# Patient Record
Sex: Male | Born: 1995 | Race: Black or African American | Hispanic: No | Marital: Single | Smoking: Never smoker
Health system: Southern US, Community
[De-identification: ages and names within clinical notes are randomized; demographics above are authoritative.]

## PROBLEM LIST (undated history)

## (undated) DIAGNOSIS — L309 Dermatitis, unspecified: Secondary | ICD-10-CM

## (undated) DIAGNOSIS — T7840XA Allergy, unspecified, initial encounter: Secondary | ICD-10-CM

## (undated) DIAGNOSIS — J302 Other seasonal allergic rhinitis: Secondary | ICD-10-CM

## (undated) DIAGNOSIS — L509 Urticaria, unspecified: Secondary | ICD-10-CM

## (undated) HISTORY — DX: Dermatitis, unspecified: L30.9

## (undated) HISTORY — PX: CIRCUMCISION: SUR203

## (undated) HISTORY — DX: Urticaria, unspecified: L50.9

## (undated) HISTORY — PX: URETHRA SURGERY: SHX824

## (undated) HISTORY — DX: Allergy, unspecified, initial encounter: T78.40XA

---
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
## 2017-05-01 ENCOUNTER — Telehealth: Payer: Self-pay

## 2017-05-01 NOTE — Telephone Encounter (Signed)
Pre visit call completed 

## 2017-05-02 ENCOUNTER — Ambulatory Visit (INDEPENDENT_AMBULATORY_CARE_PROVIDER_SITE_OTHER): Payer: Managed Care, Other (non HMO) | Admitting: Family Medicine

## 2017-05-02 VITALS — BP 110/78 | HR 77 | Temp 99.0°F | Ht 69.0 in | Wt 139.8 lb

## 2017-05-02 DIAGNOSIS — F32A Depression, unspecified: Secondary | ICD-10-CM

## 2017-05-02 DIAGNOSIS — Z9189 Other specified personal risk factors, not elsewhere classified: Secondary | ICD-10-CM

## 2017-05-02 DIAGNOSIS — R5383 Other fatigue: Secondary | ICD-10-CM | POA: Diagnosis not present

## 2017-05-02 DIAGNOSIS — F329 Major depressive disorder, single episode, unspecified: Secondary | ICD-10-CM

## 2017-05-02 NOTE — Progress Notes (Signed)
[facility] [place] [address], [address] [place], [place] [address] 336 [phone] [phone]  Date:  05/02/2017   Name:  Mark Juarez   DOB:  03/11/1996   MRN:  [id number]  PCP:  Pearline Cablesopland, Jessica C, MD    Chief Complaint: Establish Care (Pt here to est care. c/o weight loss over the past 6 months. )   History of Present Illness:  Mark Juarez is a 21 y.o. very pleasant male patient who presents with the following:  Here today as a new patient to our office.  Reviewed available records on Epic  He had been getting his care from another doctor through his medicaid but this doctor has left the area and he now has [place] insurnace  His mother does most of the talking today- she reports that her son as become very unmotivated over the last 6-9 months.  He  "just wants to play x-box all day."  Also that does not eat well, mostly eats junk food.  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  He is studying online only and is out of class for the summer He lives with his parents.   He tends to go to bed at 0500- stays up very late playing videogames, will get up around noon He never got his learners permit and does not drive  He does have a few friends in this area but rarely will get together with them  He graduated HS last year- in 2017.   No major changes at home  Mother states that she asks him about self harm all the time and he denies any risk of this They also deny any substance abuse   His physical health has been generally good except for mild asthma and allergies   There are no active problems to display for this patient.   Past Medical History:  Diagnosis Date  . Allergy   . Asthma   . Seasonal allergies     Past Surgical History:  Procedure Laterality Date  . CIRCUMCISION     . URETHRA SURGERY      Social History  Substance Use Topics  . Smoking status: Never Smoker  . Smokeless tobacco: Not on file  . Alcohol use No    Family History  Problem Relation Age of Onset  . Diabetes Mother   . Hypertension Mother   . Hyperlipidemia Mother   . Heart attack Mother   . Heart disease Mother   . Diabetes Father   . Hypertension Father   . Hyperlipidemia Father     Allergies  Allergen Reactions  . Peanuts [Peanut Oil] Anaphylaxis  . Penicillins Swelling    Medication list has been reviewed and updated.  Current Outpatient Prescriptions on File Prior to Visit  Medication Sig Dispense Refill  . albuterol (PROVENTIL HFA;VENTOLIN HFA) 108 (90 BASE) MCG/ACT inhaler Inhale 2 puffs into the lungs every 6 (six) hours as needed. For shortness of breath    . albuterol (PROVENTIL) (2.5 MG/3ML) 0.083% nebulizer solution Take 2.5 mg by nebulization every 6 (six) hours as needed. For shortness of breath    . EPINEPHrine (EPIPEN) 0.3 mg/0.3 mL IJ SOAJ injection Inject 0.3 mLs (0.3 mg total) into the muscle as needed. 2 Device 1  .  loratadine (CLARITIN) 10 MG tablet Take 10 mg by mouth daily.     No current facility-administered medications on file prior to visit.     Review of Systems:  As per HPI- otherwise negative. Denies any physical pain or other physical sx No SI   Physical Examination: Vitals:   05/02/17 1636  BP: 110/78  Pulse: 77  Temp: 99 F (37.2 C)   Vitals:   05/02/17 1636  Weight: 139 lb 12.8 oz (63.4 kg)  Height: 5\' 9"  (1.753 m)   Body mass index is 20.64 kg/m. Ideal Body Weight: Weight in (lb) to have BMI = 25: 168.9  GEN: WDWN, NAD, Non-toxic, A & O x 3, normal weight but on the thin side, does not say much HEENT: Atraumatic, Normocephalic. Neck supple. No masses, No LAD. Ears and Nose: No external deformity. CV: RRR, No M/G/R. No JVD. No thrill. No extra heart sounds. PULM: CTA B, no wheezes, crackles, rhonchi. No retractions.  No resp. distress. No accessory muscle use. ABD: S, NT, ND, +BS. No rebound. No HSM. EXTR: No c/c/e NEURO Normal gait.  PSYCH: quiet, does not speak for himself like most teens his age   Assessment and Plan: Fatigue due to depression - Plan: CBC, Comprehensive metabolic panel, Lipid panel, TSH  Lack of motivation  Here today with his parents due to concern that he has lost motivation to move forward in life and is mostly sitting around playing video games.  Detailed discussion with the family- I suspect that much of Khush's sx are due to depression, and suggested that we start medication. Bright does not wish to start mediation at this time, but does seem to understand that he will need to make some changes in his life.  We discussed several specific goals for him to work towards Will also obtain basic labs as above to look for any physical cause of his sx Plan to visit in one month   Signed Abbe Amsterdam, MD

## 2017-05-02 NOTE — Patient Instructions (Addendum)
It was very nice to see you today!  I will be in touch with your labs I am sorry that you are having a hard time, but do think that you will be able to feel better by making a few changes Please work on keeping a normal eat/ sleep/ work/ exercise schedule. Staying up all night is not a good idea!  We need you to find a job and also start working out again.  These steps will help to boost your mood I would also encourage you to get your driving permit and work towards your license- this will give you greater freedom and confidence.    Please see me in about one month so we can check on how you are doing.  If you are not seeing improvement we may want to consider medication.  I do suspect that you are suffering from depression

## 2017-05-03 ENCOUNTER — Encounter: Payer: Self-pay | Admitting: Family Medicine

## 2017-05-03 LAB — COMPREHENSIVE METABOLIC PANEL
ALBUMIN: 4.8 g/dL (ref 3.5–5.2)
ALK PHOS: 61 U/L (ref 52–171)
ALT: 8 U/L (ref 0–53)
AST: 12 U/L (ref 0–37)
BILIRUBIN TOTAL: 0.9 mg/dL (ref 0.3–1.2)
BUN: 10 mg/dL (ref 6–23)
CALCIUM: 10.3 mg/dL (ref 8.4–10.5)
CO2: 29 mEq/L (ref 19–32)
Chloride: 104 mEq/L (ref 96–112)
Creatinine, Ser: 1.04 mg/dL (ref 0.40–1.50)
GFR: 118.47 mL/min (ref >60.00)
Glucose, Bld: 88 mg/dL (ref 70–99)
POTASSIUM: 3.9 meq/L (ref 3.5–5.1)
Sodium: 140 mEq/L (ref 135–145)
TOTAL PROTEIN: 8.2 g/dL (ref 6.0–8.3)

## 2017-05-03 LAB — CBC
HEMATOCRIT: 42.9 % (ref 36.0–49.0)
HEMOGLOBIN: 13.8 g/dL (ref 12.0–16.0)
MCHC: 32.2 g/dL (ref 31.0–37.0)
MCV: 75.7 fl — ABNORMAL LOW (ref 78.0–98.0)
PLATELETS: 223 10*3/uL (ref 150.0–575.0)
RBC: 5.67 Mil/uL (ref 3.80–5.70)
RDW: 15.1 % (ref 11.4–15.5)
WBC: 3.7 10*3/uL — ABNORMAL LOW (ref 4.5–13.5)

## 2017-05-03 LAB — TSH: TSH: 1.05 u[IU]/mL (ref 0.40–5.00)

## 2017-05-03 LAB — LIPID PANEL
CHOLESTEROL: 186 mg/dL (ref 0–200)
HDL: 44.1 mg/dL (ref >39.00)
LDL Cholesterol: 133 mg/dL — ABNORMAL HIGH (ref 0–99)
NonHDL: 141.6
TRIGLYCERIDES: 43 mg/dL (ref 0.0–149.0)
Total CHOL/HDL Ratio: 4
VLDL: 8.6 mg/dL (ref 0.0–40.0)

## 2018-02-04 NOTE — Progress Notes (Deleted)
[facility] [address], [address] [place], [place] [address] [phone] [phone]  Date:  02/05/2018   Name:  Mark Juarez   DOB:  03/02/1996   MRN:  [id number]  PCP:  Pearline Cablesopland, Meloney Feld C, MD    Chief Complaint: No chief complaint on file.   History of Present Illness:  Mark Juarez is a 22 y.o. very pleasant male patient who presents with the following:  Here today for a CPE I have seen this pt once previously, in May of 2018.  At that time his mother was concerned that he seemed depressed, was playing video games all day and not doing much else Here today with his parents due to concern that he has lost motivation to move forward in life and is mostly sitting around playing video games.  Detailed discussion with the family- I suspect that much of Mark Juarez's sx are due to depression, and suggested that we start medication. Mark Juarez does not wish to start mediation at this time, but does seem to understand that he will need to make some changes in his life.  We discussed several specific goals for him to work towards Will also obtain basic labs as above to look for any physical cause of his sx Plan to visit in one month   His lab eval at that time showed minimally low wbc count, OW normal  Flu: Tetanus: Gardasil:  Will need to pull [place] shot records for him  There are no active problems to display for this patient.   Past Medical History:  Diagnosis Date  . Allergy   . Asthma   . Seasonal allergies     Past Surgical History:  Procedure Laterality Date  . CIRCUMCISION    . URETHRA SURGERY      Social History   Tobacco Use  . Smoking status: Never Smoker  Substance Use Topics  . Alcohol use: No  . Drug use: No    Family History  Problem Relation Age of Onset  . Diabetes Mother   . Hypertension Mother   . Hyperlipidemia Mother   . Heart attack Mother   . Heart disease Mother   . Diabetes Father   . Hypertension  Father   . Hyperlipidemia Father     Allergies  Allergen Reactions  . Peanuts [Peanut Oil] Anaphylaxis  . Penicillins Swelling    Medication list has been reviewed and updated.  Current Outpatient Medications on File Prior to Visit  Medication Sig Dispense Refill  . albuterol (PROVENTIL HFA;VENTOLIN HFA) 108 (90 BASE) MCG/ACT inhaler Inhale 2 puffs into the lungs every 6 (six) hours as needed. For shortness of breath    . albuterol (PROVENTIL) (2.5 MG/3ML) 0.083% nebulizer solution Take 2.5 mg by nebulization every 6 (six) hours as needed. For shortness of breath    . EPINEPHrine (EPIPEN) 0.3 mg/0.3 mL IJ SOAJ injection Inject 0.3 mLs (0.3 mg total) into the muscle as needed. 2 Device 1  . loratadine (CLARITIN) 10 MG tablet Take 10 mg by mouth daily.     No current facility-administered medications on file prior to visit.     Review of Systems:  As per HPI- otherwise negative.   Physical Examination: There were no vitals filed for this visit. There were no vitals filed for this visit. There is no height or weight on file to calculate BMI. Ideal Body Weight:    GEN: WDWN, NAD, Non-toxic, A & O x  3 HEENT: Atraumatic, Normocephalic. Neck supple. No masses, No LAD. Ears and Nose: No external deformity. CV: RRR, No M/G/R. No JVD. No thrill. No extra heart sounds. PULM: CTA B, no wheezes, crackles, rhonchi. No retractions. No resp. distress. No accessory muscle use. ABD: S, NT, ND, +BS. No rebound. No HSM. EXTR: No c/c/e NEURO Normal gait.  PSYCH: Normally interactive. Conversant. Not depressed or anxious appearing.  Calm demeanor.    Assessment and Plan: ***  Signed Abbe AmsterdamJessica Mauricio Dahlen, MD

## 2018-02-05 ENCOUNTER — Encounter: Payer: Managed Care, Other (non HMO) | Admitting: Family Medicine

## 2018-03-04 NOTE — Progress Notes (Signed)
[facility] [address] [place], [place] [address] [phone]  Date:  03/06/2018   Name:  Mark Juarez   DOB:  March 07, 1996   MRN:  [id number]  PCP:  Pearline Cables, MD    Chief Complaint: Annual Exam   History of Present Illness:  Mark Juarez is a 22 y.o. very pleasant male patient who presents with the following:  Here today for a CPE I last saw him as a new patient just over a year ago:  He had been getting his care from another doctor through his medicaid but this doctor has left the area and he now has [place] insurnace  His mother does most of the talking today- she reports that her son as become very unmotivated over the last 6-9 months.  He  "just wants to play x-box all day."  Also that does not eat well, mostly eats junk food.  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  He is studying online only and is out of class for the summer He lives with his parents.   He tends to go to bed at 0500- stays up very late playing videogames, will get up around noon He never got his learners permit and does not drive He does have a few friends in this area but rarely will get together with the He graduated HS last year- in 2017.   No major changes at home Mother states that she asks him about self harm all the time and he denies any risk of this They also deny any substance abuse  His physical health has been generally good except for mild asthma and allergies  A/P Here today with his parents due to concern that he has lost motivation to move forward in life and is mostly sitting around playing video games.  Detailed discussion with the family- I suspect that much of Mark Juarez's sx are due to depression, and suggested that we start medication. Osmar does not  wish to start mediation at this time, but does seem to understand that he will need to make some changes in his life.  We discussed several specific goals for him to work towards Will also obtain basic labs as above to look for any physical cause of his sx Plan to visit in one month   Labs were ok except for mild leukopenia a year ago Pt reports that he is doing well; his mother is again present and agrees that he is doing better He is now at [employer], and he is working at [employer] over the summer He feels like his mood is better He is more active and motivated. He is spending time with friends in real life- not just over computers/ phone.  He would like to have a flu shot and start HPV series today.  Pulled his immunization records from the [facility] database but it does not seem to be complete- his 6th grade shots are missing.  His mother will bring in the records that she has at home as well  They have noted some sinus drainage- for about one month He is sneezing Some runny nose Some cough No fever He tried some dayquil OTC, zyrtec  He still has not gotten his drivers  permit- however he wants to do so and wants to get a car.   He is not exercising regularly at this time Lives with his mother  There are no active problems to display for this patient.   Past Medical History:  Diagnosis Date  . Allergy   . Asthma   . Seasonal allergies     Past Surgical History:  Procedure Laterality Date  . CIRCUMCISION    . URETHRA SURGERY      Social History   Tobacco Use  . Smoking status: Never Smoker  . Smokeless tobacco: Never Used  Substance Use Topics  . Alcohol use: No  . Drug use: No    Family History  Problem Relation Age of Onset  . Diabetes Mother   . Hypertension Mother   . Hyperlipidemia Mother   . Heart attack Mother   . Heart disease Mother   . Diabetes Father   . Hypertension Father   . Hyperlipidemia Father     Allergies  Allergen Reactions  . Peanuts [Peanut  Oil] Anaphylaxis  . Penicillins Swelling    Medication list has been reviewed and updated.  Current Outpatient Medications on File Prior to Visit  Medication Sig Dispense Refill  . albuterol (PROVENTIL HFA;VENTOLIN HFA) 108 (90 BASE) MCG/ACT inhaler Inhale 2 puffs into the lungs every 6 (six) hours as needed. For shortness of breath    . albuterol (PROVENTIL) (2.5 MG/3ML) 0.083% nebulizer solution Take 2.5 mg by nebulization every 6 (six) hours as needed. For shortness of breath    . EPINEPHrine (EPIPEN) 0.3 mg/0.3 mL IJ SOAJ injection Inject 0.3 mLs (0.3 mg total) into the muscle as needed. 2 Device 1  . loratadine (CLARITIN) 10 MG tablet Take 10 mg by mouth daily.     No current facility-administered medications on file prior to visit.     Review of Systems:  As per HPI- otherwise negative. No fever or chills He feels that his mood is better and he is more positive about life   Physical Examination: Vitals:   03/06/18 1049  BP: 90/74  Pulse: 94  Resp: 16  SpO2: 100%   Vitals:   03/06/18 1049  Weight: 146 lb 3.2 oz (66.3 kg)  Height: 5\' 9"  (1.753 m)   Body mass index is 21.59 kg/m. Ideal Body Weight: Weight in (lb) to have BMI = 25: 168.9  GEN: WDWN, NAD, Non-toxic, A & O x 3 HEENT: Atraumatic, Normocephalic. Neck supple. No masses, No LAD.  Bilateral TM wnl, oropharynx normal.  PEERL,EOMI.   Ears and Nose: No external deformity. CV: RRR, No M/G/R. No JVD. No thrill. No extra heart sounds. PULM: CTA B, no wheezes, crackles, rhonchi. No retractions. No resp. distress. No accessory muscle use. ABD: S, NT, ND. No rebound. No HSM. EXTR: No c/c/e NEURO Normal gait.  PSYCH: Normally interactive. Conversant. Not depressed or anxious appearing.  Calm demeanor.  Looks well, normal weight Normal DTR of all extremities   Repeat BP 105/80  Assessment and Plan: Physical exam  Other neutropenia (HCC) - Plan: CBC with Differential/Platelet  Immunization due - Plan: Flu  Vaccine QUAD 6+ mos PF IM (Fluarix Quad PF), HPV 9-valent vaccine,Recombinat  Following up/ CPE today Mark Juarez is doing better- he is in school and planning on working over the summer He and his mom feel like he is more active and mood is better Encouraged him to start a regular exercise program His mom will bring in the rest of his immunizations for  me to complete in his chart Will plan further follow- up pending labs.   Signed Abbe AmsterdamJessica Copland, MD  Received labs 3/23, message to pt:  White cell count is back to normal No anemia Your MCV (average red cell size) is slighly low.  This may indicate that you are low on iron. However, your MCV is just slightly low.  I would encourage you to try and increase your dietary iron (fortified cereal, also using a case iron skillet to cook some of the time can help). Overall I am not concerned and am glad to see your white cell count back to normal!  Please work on starting an exercise program and take care!  Let's visit in one year  Results for orders placed or performed in visit on 03/06/18  CBC with Differential/Platelet  Result Value Ref Range   WBC 4.8 4.5 - 13.5 K/uL   RBC 5.81 (H) 3.80 - 5.70 Mil/uL   Hemoglobin 14.2 12.0 - 16.0 g/dL   HCT 16.144.9 09.636.0 - 04.549.0 %   MCV 77.2 (L) 78.0 - 98.0 fl   MCHC 31.7 31.0 - 37.0 g/dL   RDW 40.914.6 81.111.4 - 91.415.5 %   Platelets 214.0 150.0 - 575.0 K/uL   Neutrophils Relative % 33.6 (L) 43.0 - 71.0 %   Lymphocytes Relative 48.7 (H) 24.0 - 48.0 %   Monocytes Relative 10.1 3.0 - 12.0 %   Eosinophils Relative 7.6 (H) 0.0 - 5.0 %   Basophils Relative 0.0 0.0 - 3.0 %   Neutro Abs 1.6 1.4 - 7.7 K/uL   Lymphs Abs 2.3 0.7 - 4.0 K/uL   Monocytes Absolute 0.5 0.1 - 1.0 K/uL   Eosinophils Absolute 0.4 0.0 - 0.7 K/uL   Basophils Absolute 0.0 0.0 - 0.1 K/uL

## 2018-03-06 ENCOUNTER — Encounter: Payer: Self-pay | Admitting: Family Medicine

## 2018-03-06 ENCOUNTER — Ambulatory Visit (INDEPENDENT_AMBULATORY_CARE_PROVIDER_SITE_OTHER): Payer: PRIVATE HEALTH INSURANCE | Admitting: Family Medicine

## 2018-03-06 VITALS — BP 90/74 | HR 94 | Resp 16 | Ht 69.0 in | Wt 146.2 lb

## 2018-03-06 DIAGNOSIS — D708 Other neutropenia: Secondary | ICD-10-CM | POA: Diagnosis not present

## 2018-03-06 DIAGNOSIS — Z23 Encounter for immunization: Secondary | ICD-10-CM

## 2018-03-06 DIAGNOSIS — Z0000 Encounter for general adult medical examination without abnormal findings: Secondary | ICD-10-CM | POA: Diagnosis not present

## 2018-03-06 LAB — CBC WITH DIFFERENTIAL/PLATELET
BASOS ABS: 0 10*3/uL (ref 0.0–0.1)
BASOS PCT: 0 % (ref 0.0–3.0)
EOS ABS: 0.4 10*3/uL (ref 0.0–0.7)
Eosinophils Relative: 7.6 % — ABNORMAL HIGH (ref 0.0–5.0)
HEMATOCRIT: 44.9 % (ref 36.0–49.0)
HEMOGLOBIN: 14.2 g/dL (ref 12.0–16.0)
LYMPHS PCT: 48.7 % — AB (ref 24.0–48.0)
Lymphs Abs: 2.3 10*3/uL (ref 0.7–4.0)
MCHC: 31.7 g/dL (ref 31.0–37.0)
MCV: 77.2 fl — ABNORMAL LOW (ref 78.0–98.0)
Monocytes Absolute: 0.5 10*3/uL (ref 0.1–1.0)
Monocytes Relative: 10.1 % (ref 3.0–12.0)
Neutro Abs: 1.6 10*3/uL (ref 1.4–7.7)
Neutrophils Relative %: 33.6 % — ABNORMAL LOW (ref 43.0–71.0)
Platelets: 214 10*3/uL (ref 150.0–575.0)
RBC: 5.81 Mil/uL — ABNORMAL HIGH (ref 3.80–5.70)
RDW: 14.6 % (ref 11.4–15.5)
WBC: 4.8 10*3/uL (ref 4.5–13.5)

## 2018-03-06 NOTE — Patient Instructions (Addendum)
Good to see you again today!  I suspect you have allergies causing your nasal symptoms Please try adding a nasal steroid spray such as flonase once a day. If this is not helpful please let me know   You got your first HPV vaccine today, this is a 3 shot series 2nd shot in 1-2 months, 3rd in 6 months  Please bring in your home shot records when you have a chance so I can make sure all is complete  I am go glad to see you doing well!  Keep up the good work with your studies.  I would also encourage you to start a regular exercise program such as jogging, weights, or sports

## 2018-03-08 ENCOUNTER — Encounter: Payer: Self-pay | Admitting: Family Medicine

## 2018-03-22 ENCOUNTER — Other Ambulatory Visit: Payer: Self-pay | Admitting: Family Medicine

## 2018-05-08 ENCOUNTER — Ambulatory Visit (INDEPENDENT_AMBULATORY_CARE_PROVIDER_SITE_OTHER): Payer: PRIVATE HEALTH INSURANCE

## 2018-05-08 DIAGNOSIS — Z23 Encounter for immunization: Secondary | ICD-10-CM | POA: Diagnosis not present

## 2018-05-08 NOTE — Progress Notes (Signed)
Pre visit review using our clinic review tool, if applicable. No additional management support is needed unless otherwise documented below in the visit note.  Pt here today for 2nd HPV injection. 0.60mL injected into L deltoid. Pt tolerated injection well.

## 2018-05-08 NOTE — Progress Notes (Signed)
Noted  Darchelle Nunes R Lowne Chase, DO  

## 2018-10-17 ENCOUNTER — Ambulatory Visit (INDEPENDENT_AMBULATORY_CARE_PROVIDER_SITE_OTHER): Payer: PRIVATE HEALTH INSURANCE

## 2018-10-17 DIAGNOSIS — Z23 Encounter for immunization: Secondary | ICD-10-CM

## 2019-02-09 ENCOUNTER — Encounter (HOSPITAL_BASED_OUTPATIENT_CLINIC_OR_DEPARTMENT_OTHER): Payer: Self-pay | Admitting: *Deleted

## 2019-02-09 ENCOUNTER — Emergency Department (HOSPITAL_BASED_OUTPATIENT_CLINIC_OR_DEPARTMENT_OTHER)
Admission: EM | Admit: 2019-02-09 | Discharge: 2019-02-09 | Disposition: A | Discharge status: Home / Self Care | Payer: 59 | Attending: Emergency Medicine | Admitting: Emergency Medicine

## 2019-02-09 ENCOUNTER — Other Ambulatory Visit: Payer: Self-pay

## 2019-02-09 DIAGNOSIS — Z91010 Allergy to peanuts: Secondary | ICD-10-CM | POA: Diagnosis not present

## 2019-02-09 DIAGNOSIS — J45909 Unspecified asthma, uncomplicated: Secondary | ICD-10-CM | POA: Diagnosis not present

## 2019-02-09 DIAGNOSIS — Z79899 Other long term (current) drug therapy: Secondary | ICD-10-CM | POA: Insufficient documentation

## 2019-02-09 DIAGNOSIS — T783XXA Angioneurotic edema, initial encounter: Secondary | ICD-10-CM | POA: Diagnosis not present

## 2019-02-09 DIAGNOSIS — R600 Localized edema: Secondary | ICD-10-CM | POA: Diagnosis present

## 2019-02-09 MED ORDER — DIPHENHYDRAMINE HCL 25 MG PO CAPS
50.0000 mg | ORAL_CAPSULE | Freq: Once | ORAL | Status: AC
  Administered 2019-02-09: 50 mg via ORAL
  Filled 2019-02-09: qty 2

## 2019-02-09 MED ORDER — EPINEPHRINE 0.15 MG/0.3ML IJ SOAJ: 0.3000 mg | INTRAMUSCULAR | 0 refills | Status: DC | PRN

## 2019-02-09 MED ORDER — DEXAMETHASONE 6 MG PO TABS
10.0000 mg | ORAL_TABLET | Freq: Once | ORAL | Status: AC
  Administered 2019-02-09: 10 mg via ORAL
  Filled 2019-02-09: qty 1

## 2019-02-09 MED ORDER — FAMOTIDINE 20 MG PO TABS
20.0000 mg | ORAL_TABLET | Freq: Once | ORAL | Status: AC
  Administered 2019-02-09: 20 mg via ORAL
  Filled 2019-02-09: qty 1

## 2019-02-09 NOTE — ED Provider Notes (Signed)
[facility] EMERGENCY DEPARTMENT Provider Note   CSN: [id number] Arrival date & time: 02/09/19  1351    History   Chief Complaint Chief Complaint  Patient presents with  . Lips Swollen    HPI Mark Juarez is a 23 y.o. male.     Patient is a 23 year old African-American male with past medical history of peanut allergy who presents emergency department for lip swelling after coming in contact with peanut butter.  Reports that his father made a peanut butter sandwich this morning and he thinks he came in contact with some excess peanut behind the counter.  Reports that his lips began to swell around 8 AM.  He has been taking Benadryl since then and he states that his lip swelling has actually improved.  Denies any tongue swelling, swelling of the throat, trouble breathing or swallowing, itching or irritation of the throat, shortness of breath.  He has had history of the same in the past but has never required epinephrine for intubation or hospitalization.  Currently reports that he feels normal and his lip is almost back down to its normal size     Past Medical History:  Diagnosis Date  . Allergy   . Asthma   . Seasonal allergies     There are no active problems to display for this patient.   Past Surgical History:  Procedure Laterality Date  . CIRCUMCISION    . URETHRA SURGERY          Home Medications    Prior to Admission medications   Medication Sig Start Date End Date Taking? Authorizing Provider  albuterol (PROVENTIL HFA;VENTOLIN HFA) 108 (90 BASE) MCG/ACT inhaler Inhale 2 puffs into the lungs every 6 (six) hours as needed. For shortness of breath   Yes [provider]  albuterol (PROVENTIL) (2.5 MG/3ML) 0.083% nebulizer solution Take 2.5 mg by nebulization every 6 (six) hours as needed. For shortness of breath   Yes [provider]  EPINEPHrine (EPIPEN JR) 0.15 MG/0.3ML injection Inject 0.6 mLs (0.3 mg total) into the muscle as needed  for anaphylaxis. 02/09/19   Arlyn Dunning, PA-C  loratadine (CLARITIN) 10 MG tablet Take 10 mg by mouth daily.    [provider]    Family History Family History  Problem Relation Age of Onset  . Diabetes Mother   . Hypertension Mother   . Hyperlipidemia Mother   . Heart attack Mother   . Heart disease Mother   . Diabetes Father   . Hypertension Father   . Hyperlipidemia Father     Social History Social History   Tobacco Use  . Smoking status: Never Smoker  . Smokeless tobacco: Never Used  Substance Use Topics  . Alcohol use: No  . Drug use: No     Allergies   Peanuts [peanut oil] and Penicillins   Review of Systems Review of Systems  Constitutional: Negative for chills and fever.  HENT: Positive for facial swelling. Negative for ear pain, sore throat, trouble swallowing and voice change.   Eyes: Negative for pain and visual disturbance.  Respiratory: Negative for cough, shortness of breath, wheezing and stridor.   Cardiovascular: Negative for chest pain and palpitations.  Gastrointestinal: Negative for abdominal pain and vomiting.  Genitourinary: Negative for dysuria and hematuria.  Musculoskeletal: Negative for arthralgias and back pain.  Skin: Negative for color change and rash.  Neurological: Negative for seizures and syncope.  All other systems reviewed and are negative.    Physical Exam  Updated Vital Signs BP 125/80 (BP Location: Left Arm)   Pulse 85   Temp 97.8 F (36.6 C) (Oral)   Resp 14   Ht 5\' 10"  (1.778 m)   Wt 65.8 kg   SpO2 100%   BMI 20.81 kg/m   Physical Exam Vitals signs and nursing note reviewed.  Constitutional:      Appearance: Normal appearance.  HENT:     Head: Normocephalic.     Nose: Nose normal.     Mouth/Throat:     Mouth: Mucous membranes are moist.     Pharynx: Oropharynx is clear. No oropharyngeal exudate or posterior oropharyngeal erythema.     Comments: No swelling of tongue or throat. No obvious  swelling of the lips but patient states that upper lip feels mildly swollen. His mother showed me a photo of his lips this morning which were markedly swollen and currently he appears significantly improved.  Eyes:     Conjunctiva/sclera: Conjunctivae normal.  Pulmonary:     Effort: Pulmonary effort is normal.  Skin:    General: Skin is dry.  Neurological:     Mental Status: He is alert.  Psychiatric:        Mood and Affect: Mood normal.      ED Treatments / Results  Labs (all labs ordered are listed, but only abnormal results are displayed) Labs Reviewed - No data to display  EKG None  Radiology No results found.  Procedures Procedures (including critical care time)  Medications Ordered in ED Medications  famotidine (PEPCID) tablet 20 mg (20 mg Oral Given 02/09/19 1501)  dexamethasone (DECADRON) tablet 10 mg (10 mg Oral Given 02/09/19 1501)  diphenhydrAMINE (BENADRYL) capsule 50 mg (50 mg Oral Given 02/09/19 1501)     Initial Impression / Assessment and Plan / ED Course  I have reviewed the triage vital signs and the nursing notes.  Pertinent labs & imaging results that were available during my care of the patient were reviewed by me and considered in my medical decision making (see chart for details).  Clinical Course as of Feb 09 1523  Mon Feb 09, 2019  1517 Patient is having improving symptoms of angio edema. Hx of same in the past, unremarkable physical exam. No need for epinephrine. Patient given benadryl, pepcid, decadron. Discharged in stable condition with return precautions and allergy precautions and rx for epipen   [KM]    Clinical Course User Index [KM] Arlyn Dunning, PA-C       Based on review of vitals, medical screening exam, lab work and/or imaging, there does not appear to be an acute, emergent etiology for the patient's symptoms. Counseled pt on good return precautions and encouraged both PCP and ED follow-up as needed.    Clinical  Impression: 1. Angioedema, initial encounter     Disposition: Discharge   This note was prepared with assistance of Dragon voice recognition software. Occasional wrong-word or sound-a-like substitutions may have occurred due to the inherent limitations of voice recognition software.   Final Clinical Impressions(s) / ED Diagnoses   Final diagnoses:  Angioedema, initial encounter    ED Discharge Orders         Ordered    EPINEPHrine (EPIPEN JR) 0.15 MG/0.3ML injection  As needed     02/09/19 1521           Jeral Pinch 02/09/19 1524    Maia Plan, MD 02/10/19 1016

## 2019-02-09 NOTE — Discharge Instructions (Signed)
Continue to take daily zyrtec. Always have easy, fast access to an epipen incase you need it. Thank you for allowing me to care for you today. Please return to the emergency department if you have new or worsening symptoms. Take your medications as instructed.

## 2019-02-09 NOTE — ED Triage Notes (Signed)
States after being around peanut butter his lips started swelling. No distress noted.

## 2019-02-16 ENCOUNTER — Ambulatory Visit: Payer: Self-pay | Admitting: Allergy

## 2019-03-06 ENCOUNTER — Ambulatory Visit: Payer: Self-pay | Admitting: Allergy

## 2019-03-12 ENCOUNTER — Encounter: Payer: PRIVATE HEALTH INSURANCE | Admitting: Family Medicine

## 2019-05-19 NOTE — Progress Notes (Deleted)
[facility] [address], [address] [place], [place] [address] [phone]  Date:  05/21/2019   Name:  Mark Juarez   DOB:  1995-12-29   MRN:  [id number]  PCP:  Pearline Cables, MD    Chief Complaint: No chief complaint on file.   History of Present Illness:  Mark Juarez is a 23 y.o. very pleasant male patient who presents with the following:  Young man here for complete physical He was seen in the ER in February with angioedema thought due to accidental contact with peanut butter-he has a known peanut allergy  I last saw him in the office in March 2019.  The year previous his mom had been very concerned about lack of motivation.  We discussed medication for depression, the patient declined at that time.  Last year he was doing better, was taking classes at [employer] and working at [employer].  He was more motivated and active We started the Gardasil series for him last year  Due for final Gardasil shot. There also was a question about his 6 grade shots, we could not find his records.  He may need a Tdap There are no active problems to display for this patient.   Past Medical History:  Diagnosis Date  . Allergy   . Asthma   . Seasonal allergies     Past Surgical History:  Procedure Laterality Date  . CIRCUMCISION    . URETHRA SURGERY      Social History   Tobacco Use  . Smoking status: Never Smoker  . Smokeless tobacco: Never Used  Substance Use Topics  . Alcohol use: No  . Drug use: No    Family History  Problem Relation Age of Onset  . Diabetes Mother   . Hypertension Mother   . Hyperlipidemia Mother   . Heart attack Mother   . Heart disease Mother   . Diabetes Father   . Hypertension Father   . Hyperlipidemia Father     Allergies  Allergen Reactions  . Peanuts [Peanut Oil] Anaphylaxis  . Penicillins Swelling    Medication list has been reviewed and updated.  Current Outpatient Medications on File  Prior to Visit  Medication Sig Dispense Refill  . albuterol (PROVENTIL HFA;VENTOLIN HFA) 108 (90 BASE) MCG/ACT inhaler Inhale 2 puffs into the lungs every 6 (six) hours as needed. For shortness of breath    . albuterol (PROVENTIL) (2.5 MG/3ML) 0.083% nebulizer solution Take 2.5 mg by nebulization every 6 (six) hours as needed. For shortness of breath    . EPINEPHrine (EPIPEN JR) 0.15 MG/0.3ML injection Inject 0.6 mLs (0.3 mg total) into the muscle as needed for anaphylaxis. 2 each 0  . loratadine (CLARITIN) 10 MG tablet Take 10 mg by mouth daily.     No current facility-administered medications on file prior to visit.     Review of Systems:  As per HPI- otherwise negative.   Physical Examination: There were no vitals filed for this visit. There were no vitals filed for this visit. There is no height or weight on file to calculate BMI. Ideal Body Weight:    GEN: WDWN, NAD, Non-toxic, A & O x 3 HEENT: Atraumatic, Normocephalic. Neck supple. No masses, No LAD. Ears and Nose: No external deformity. CV: RRR, No M/G/R. No JVD. No thrill. No extra heart sounds. PULM: CTA B, no wheezes, crackles, rhonchi. No retractions. No resp. distress. No accessory muscle use. ABD:  S, NT, ND, +BS. No rebound. No HSM. EXTR: No c/c/e NEURO Normal gait.  PSYCH: Normally interactive. Conversant. Not depressed or anxious appearing.  Calm demeanor. '  Assessment and Plan: ***  Signed Abbe AmsterdamJessica , MD

## 2019-05-20 ENCOUNTER — Encounter: Payer: PRIVATE HEALTH INSURANCE | Admitting: Family Medicine

## 2019-05-21 ENCOUNTER — Encounter: Payer: PRIVATE HEALTH INSURANCE | Admitting: Family Medicine

## 2019-06-07 NOTE — Progress Notes (Deleted)
[facility] [address] [place], [place] [address] [phone]  Date:  06/08/2019   Name:  Mark Juarez   DOB:  February 24, 1996   MRN:  [id number]  PCP:  Darreld Mclean, MD    Chief Complaint: No chief complaint on file.   History of Present Illness:  Mark Juarez is a 23 y.o. 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  He had struggled some in the past with a motivation, but was doing better at our last visit  Due for complete labs today ?  Tetanus shot, can also have his third Gardasil vaccine There are no active problems to display for this patient.   Past Medical History:  Diagnosis Date  . Allergy   . Asthma   . Seasonal allergies     Past Surgical History:  Procedure Laterality Date  . CIRCUMCISION    . URETHRA SURGERY      Social History   Tobacco Use  . Smoking status: Never Smoker  . Smokeless tobacco: Never Used  Substance Use Topics  . Alcohol use: No  . Drug use: No    Family History  Problem Relation Age of Onset  . Diabetes Mother   . Hypertension Mother   . Hyperlipidemia Mother   . Heart attack Mother   . Heart disease Mother   . Diabetes Father   . Hypertension Father   . Hyperlipidemia Father     Allergies  Allergen Reactions  . Peanuts [Peanut Oil] Anaphylaxis  . Penicillins Swelling    Medication list has been reviewed and updated.  Current Outpatient Medications on File Prior to Visit  Medication Sig Dispense Refill  . albuterol (PROVENTIL HFA;VENTOLIN HFA) 108 (90 BASE) MCG/ACT inhaler Inhale 2 puffs into the lungs every 6 (six) hours as needed. For shortness of breath    . albuterol (PROVENTIL) (2.5 MG/3ML) 0.083% nebulizer solution Take 2.5 mg by nebulization every 6 (six) hours as needed. For shortness of breath     . EPINEPHrine (EPIPEN JR) 0.15 MG/0.3ML injection Inject 0.6 mLs (0.3 mg total) into the muscle as needed for anaphylaxis. 2 each 0  . loratadine (CLARITIN) 10 MG tablet Take 10 mg by mouth daily.     No current facility-administered medications on file prior to visit.     Review of Systems:  As per HPI- otherwise negative.   Physical Examination: There were no vitals filed for this visit. There were no vitals filed for this visit. There is no height or weight on file to calculate BMI. Ideal Body Weight:    GEN: WDWN, NAD, Non-toxic, A & O x 3 HEENT: Atraumatic, Normocephalic. Neck supple. No masses, No LAD. Ears and Nose: No external deformity. CV: RRR, No M/G/R. No JVD. No thrill. No extra heart sounds. PULM: CTA B, no wheezes, crackles, rhonchi. No retractions. No resp. distress. No accessory muscle use. ABD: S, NT, ND, +BS. No rebound. No HSM. EXTR: No c/c/e NEURO Normal gait.  PSYCH: Normally interactive. Conversant. Not depressed or anxious appearing.  Calm demeanor.    Assessment and Plan: ***  Signed Lamar Blinks, MD

## 2019-06-08 ENCOUNTER — Encounter: Payer: PRIVATE HEALTH INSURANCE | Admitting: Family Medicine

## 2019-07-20 DIAGNOSIS — J45909 Unspecified asthma, uncomplicated: Secondary | ICD-10-CM | POA: Insufficient documentation

## 2019-07-22 ENCOUNTER — Encounter: Payer: Self-pay | Admitting: Family Medicine

## 2019-07-23 ENCOUNTER — Other Ambulatory Visit: Payer: Self-pay

## 2019-07-23 MED ORDER — ALBUTEROL SULFATE HFA 108 (90 BASE) MCG/ACT IN AERS: 2.0000 | INHALATION_SPRAY | Freq: Four times a day (QID) | RESPIRATORY_TRACT | 0 refills | Status: DC | PRN

## 2019-07-23 MED ORDER — ALBUTEROL SULFATE (2.5 MG/3ML) 0.083% IN NEBU: 2.5000 mg | INHALATION_SOLUTION | Freq: Four times a day (QID) | RESPIRATORY_TRACT | 0 refills | Status: DC | PRN

## 2019-07-23 NOTE — Telephone Encounter (Signed)
I believe we thought about having this patient dismissed  due to no shows, however mom is calling now upset requesting a physical with you. Actual dismissal letter was never sent.  Please advise.

## 2019-07-23 NOTE — Telephone Encounter (Signed)
Patient has had several no shows with you recently. Please advise I will schedule patient appointment based on your request.

## 2019-07-24 ENCOUNTER — Other Ambulatory Visit: Payer: Self-pay

## 2019-07-24 ENCOUNTER — Ambulatory Visit (INDEPENDENT_AMBULATORY_CARE_PROVIDER_SITE_OTHER): Payer: PRIVATE HEALTH INSURANCE | Admitting: Family Medicine

## 2019-07-24 VITALS — BP 112/79 | HR 73 | Temp 97.0°F | Wt 153.0 lb

## 2019-07-24 DIAGNOSIS — J40 Bronchitis, not specified as acute or chronic: Secondary | ICD-10-CM

## 2019-07-24 DIAGNOSIS — J4541 Moderate persistent asthma with (acute) exacerbation: Secondary | ICD-10-CM | POA: Diagnosis not present

## 2019-07-24 DIAGNOSIS — J069 Acute upper respiratory infection, unspecified: Secondary | ICD-10-CM | POA: Diagnosis not present

## 2019-07-24 DIAGNOSIS — Z20822 Contact with and (suspected) exposure to covid-19: Secondary | ICD-10-CM

## 2019-07-24 MED ORDER — FLUTICASONE PROPIONATE 50 MCG/ACT NA SUSP: 2.0000 | Freq: Every day | NASAL | 6 refills | Status: DC

## 2019-07-24 MED ORDER — AZITHROMYCIN 250 MG PO TABS: ORAL_TABLET | ORAL | 0 refills | Status: DC

## 2019-07-24 MED ORDER — PROAIR HFA 108 (90 BASE) MCG/ACT IN AERS: 2.0000 | INHALATION_SPRAY | Freq: Four times a day (QID) | RESPIRATORY_TRACT | 5 refills | Status: DC | PRN

## 2019-07-24 MED ORDER — QVAR REDIHALER 40 MCG/ACT IN AERB: 2.0000 | INHALATION_SPRAY | Freq: Two times a day (BID) | RESPIRATORY_TRACT | 2 refills | Status: DC

## 2019-07-24 NOTE — Progress Notes (Signed)
Virtual Visit via Video Note  I connected with DARTANION TEO on 07/24/19 at  8:40 AM EDT by a video enabled telemedicine application and verified that I am speaking with the correct person using two identifiers.  Location: Patient: home  Provider: office    I discussed the limitations of evaluation and management by telemedicine and the availability of in person appointments. The patient expressed understanding and agreed to proceed.  History of Present Illness: Pt is home with his mom c/o increasing trouble breathing -- tightness in his chest and wheezing  No fever   he is using his albuterol 4x a day Cough is not productive but does occur over night  Observations/Objective: Vitals:   07/24/19 0840  BP: 112/79  Pulse: 73  Temp: (!) 97 F (36.1 C)   No NAD Assessment and Plan: 1. Moderate persistent asthma with acute exacerbation con't albuterol prn Start qvar - beclomethasone (QVAR REDIHALER) 40 MCG/ACT inhaler; Inhale 2 puffs into the lungs 2 (two) times daily.  Dispense: 10.6 g; Refill: 2  2. Bronchitis z pack  And inhalers  - azithromycin (ZITHROMAX Z-PAK) 250 MG tablet; As directed  Dispense: 6 each; Refill: 0  3. Viral upper respiratory tract infection Start flonase and otc antihistamine  - fluticasone (FLONASE) 50 MCG/ACT nasal spray; Place 2 sprays into both nostrils daily.  Dispense: 16 g; Refill: 6  Follow Up Instructions:    I discussed the assessment and treatment plan with the patient. The patient was provided an opportunity to ask questions and all were answered. The patient agreed with the plan and demonstrated an understanding of the instructions.   The patient was advised to call back or seek an in-person evaluation if the symptoms worsen or if the condition fails to improve as anticipated.  I provided 15 minutes of non-face-to-face time during this encounter.   Ann Held, DO

## 2019-07-24 NOTE — Progress Notes (Signed)
Virtual Visit via Video Note  I connected with Mark Juarez on 07/24/19 at  8:40 AM EDT by a video enabled telemedicine application and verified that I am speaking with the correct person using two identifiers.  Location: Patient: home with mom Provider: office    I discussed the limitations of evaluation and management by telemedicine and the availability of in person appointments. The patient expressed understanding and agreed to proceed.  History of Present Illness: Pt is home with his parents--- c/o wheezing and coughing.  He has been using his inhaler 4 x a day Mom said he has been coughing and wheezing at night  No wheezing    Observations/Objective: Vitals:   07/24/19 0840  BP: 112/79  Pulse: 73  Temp: (!) 97 F (36.1 C)     Assessment and Plan:   Follow Up Instructions:    I discussed the assessment and treatment plan with the patient. The patient was provided an opportunity to ask questions and all were answered. The patient agreed with the plan and demonstrated an understanding of the instructions.   The patient was advised to call back or seek an in-person evaluation if the symptoms worsen or if the condition fails to improve as anticipated.  I provided 15 minutes of non-face-to-face time during this encounter.   Ann Held, DO

## 2019-07-25 ENCOUNTER — Encounter: Payer: Self-pay | Admitting: Family Medicine

## 2019-07-25 LAB — NOVEL CORONAVIRUS, NAA: SARS-CoV-2, NAA: NOT DETECTED

## 2019-08-05 ENCOUNTER — Other Ambulatory Visit: Payer: Self-pay

## 2019-08-05 ENCOUNTER — Encounter: Payer: Self-pay | Admitting: Allergy

## 2019-08-05 ENCOUNTER — Ambulatory Visit (INDEPENDENT_AMBULATORY_CARE_PROVIDER_SITE_OTHER): Payer: PRIVATE HEALTH INSURANCE | Admitting: Allergy

## 2019-08-05 VITALS — BP 108/84 | HR 70 | Temp 98.0°F | Ht 67.5 in | Wt 154.0 lb

## 2019-08-05 DIAGNOSIS — T7800XA Anaphylactic reaction due to unspecified food, initial encounter: Secondary | ICD-10-CM | POA: Insufficient documentation

## 2019-08-05 DIAGNOSIS — H1010 Acute atopic conjunctivitis, unspecified eye: Secondary | ICD-10-CM | POA: Diagnosis not present

## 2019-08-05 DIAGNOSIS — J302 Other seasonal allergic rhinitis: Secondary | ICD-10-CM | POA: Diagnosis not present

## 2019-08-05 DIAGNOSIS — J3089 Other allergic rhinitis: Secondary | ICD-10-CM

## 2019-08-05 DIAGNOSIS — J4520 Mild intermittent asthma, uncomplicated: Secondary | ICD-10-CM | POA: Diagnosis not present

## 2019-08-05 DIAGNOSIS — T7800XD Anaphylactic reaction due to unspecified food, subsequent encounter: Secondary | ICD-10-CM | POA: Diagnosis not present

## 2019-08-05 MED ORDER — MONTELUKAST SODIUM 10 MG PO TABS: 10.0000 mg | ORAL_TABLET | Freq: Every day | ORAL | 5 refills | Status: DC

## 2019-08-05 MED ORDER — EPINEPHRINE 0.3 MG/0.3ML IJ SOAJ: 0.3000 mg | Freq: Once | INTRAMUSCULAR | 2 refills | Status: DC

## 2019-08-05 NOTE — Assessment & Plan Note (Signed)
Diagnosed with asthma over 15+ years ago. Main triggers include infections, allergies and exertion. Using albuterol less than once a week but mainly spends time indoors. PCP recently prescribed Qvar (unknown dose) but has not picked up yet.   Today's spirometry was normal. . Daily controller medication(s): start Singulair 10mg  daily o Cautioned that in some children/adults can experience behavioral changes including hyperactivity, agitation, depression, sleep disturbances and suicidal ideations. These side effects are rare, but if you notice them you should notify me and discontinue Singulair (montelukast). . Prior to physical activity: May use albuterol rescue inhaler 2 puffs 5 to 15 minutes prior to strenuous physical activities. Marland Kitchen Rescue medications: May use albuterol rescue inhaler 2 puffs or nebulizer every 4 to 6 hours as needed for shortness of breath, chest tightness, coughing, and wheezing. Monitor frequency of use.  . During upper respiratory infections: Start Qvar 2 puffs twice a day for 1-2 weeks and rinse mouth afterwards.

## 2019-08-05 NOTE — Patient Instructions (Addendum)
Today's skin testing showed: Positive to peanuts and tree nuts Negative to seafood/shellfish.   Food allergy:  Continue strict avoidance of peanuts, tree nuts, seafood and shellfish.  Get bloodwork.  For mild symptoms you can take over the counter antihistamines such as Benadryl and monitor symptoms closely. If symptoms worsen or if you have severe symptoms including breathing issues, throat closure, significant swelling, whole body hives, severe diarrhea and vomiting, lightheadedness then inject epinephrine and seek immediate medical care afterwards.  Food action plan given.  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  Start environmetnal control measures as below.  May use over the counter antihistamines such as Zyrtec (cetirizine), Claritin (loratadine), Allegra (fexofenadine), or Xyzal (levocetirizine) daily as needed.  May use Flonase 1 spray twice a day as needed for sinus issues.  If above regimen does not control symptoms then recommend repeating skin testing and starting allergy injections.  Asthma: . Daily controller medication(s): start Singulair 10mg  daily o Cautioned that in some children/adults can experience behavioral changes including hyperactivity, agitation, depression, sleep disturbances and suicidal ideations. These side effects are rare, but if you notice them you should notify me and discontinue Singulair (montelukast). . Prior to physical activity: May use albuterol rescue inhaler 2 puffs 5 to 15 minutes prior to strenuous physical activities. Marland Kitchen. Rescue medications: May use albuterol rescue inhaler 2 puffs or nebulizer every 4 to 6 hours as needed for shortness of breath, chest tightness, coughing, and wheezing. Monitor frequency of use.  . During upper respiratory infections: Start Qvar 2 puffs twice a day for 1-2 weeks and rinse mouth afterwards. . Asthma control goals:   o Full participation in all desired activities (may need albuterol before activity) o Albuterol use two times or less a week on average (not counting use with activity) o Cough interfering with sleep two times or less a month o Oral steroids no more than once a year o No hospitalizations  Follow up in 3 months for asthma/allergy check or sooner if needed.   Reducing Pollen Exposure . Pollen seasons: trees (spring), grass (summer) and ragweed/weeds (fall). Marland Kitchen. Keep windows closed in your home and car to lower pollen exposure.  Lilian Kapur. Install air conditioning in the bedroom and throughout the house if possible.  . Avoid going out in dry windy days - especially early morning. . Pollen counts are highest between 5 - 10 AM and on dry, hot and windy days.  . Save outside activities for late afternoon or after a heavy rain, when pollen levels are lower.  . Avoid mowing of grass if you have grass pollen allergy. Marland Kitchen. Be aware that pollen can also be transported indoors on people and pets.  . Dry your clothes in an automatic dryer rather than hanging them outside where they might collect pollen.  . Rinse hair and eyes before bedtime. Control of House Dust Mite Allergen . Dust mite allergens are a common trigger of allergy and asthma symptoms. While they can be found throughout the house, these microscopic creatures thrive in warm, humid environments such as bedding, upholstered furniture and carpeting. . Because so much time is spent in the bedroom, it is essential to reduce mite levels there.  . Encase pillows, mattresses, and box springs in special allergen-proof fabric covers or airtight, zippered plastic covers.  . Bedding should be washed weekly in hot water (130 F) and dried in a hot dryer. Allergen-proof covers are available for  comforters and pillows that can't be regularly washed.  Mark Juarez. Wash the allergy-proof covers every few months. Minimize clutter in the bedroom. Keep pets out of the bedroom.   Marland Kitchen. Keep humidity less than 50% by using a dehumidifier or air conditioning. You can buy a humidity measuring device called a hygrometer to monitor this.  . If possible, replace carpets with hardwood, linoleum, or washable area rugs. If that's not possible, vacuum frequently with a vacuum that has a HEPA filter. . Remove all upholstered furniture and non-washable window drapes from the bedroom. . Remove all non-washable stuffed toys from the bedroom.  Wash stuffed toys weekly. Pet Allergen Avoidance: . Contrary to popular opinion, there are no "hypoallergenic" breeds of dogs or cats. That is because people are not allergic to an animal's hair, but to an allergen found in the animal's saliva, dander (dead skin flakes) or urine. Pet allergy symptoms typically occur within minutes. For some people, symptoms can build up and become most severe 8 to 12 hours after contact with the animal. People with severe allergies can experience reactions in public places if dander has been transported on the pet owners' clothing. Marland Kitchen. Keeping an animal outdoors is only a partial solution, since homes with pets in the yard still have higher concentrations of animal allergens. . Before getting a pet, ask your allergist to determine if you are allergic to animals. If your pet is already considered part of your family, try to minimize contact and keep the pet out of the bedroom and other rooms where you spend a great deal of time. . As with dust mites, vacuum carpets often or replace carpet with a hardwood floor, tile or linoleum. . High-efficiency particulate air (HEPA) cleaners can reduce allergen levels over time. . While dander and saliva are the source of cat and dog allergens, urine is the source of allergens from rabbits, hamsters, mice and [place]; so ask a non-allergic family member to clean the animal's cage. . If you have a pet allergy, talk to your allergist about the potential for allergy immunotherapy (allergy  shots). This strategy can often provide long-term relief. Mold Control . Mold and fungi can grow on a variety of surfaces provided certain temperature and moisture conditions exist.  . Outdoor molds grow on plants, decaying vegetation and soil. The major outdoor mold, Alternaria and Cladosporium, are found in very high numbers during hot and dry conditions. Generally, a late summer - fall peak is seen for common outdoor fungal spores. Rain will temporarily lower outdoor mold spore count, but counts rise rapidly when the rainy period ends. . The most important indoor molds are Aspergillus and Penicillium. Dark, humid and poorly ventilated basements are ideal sites for mold growth. The next most common sites of mold growth are the bathroom and the kitchen. Outdoor (Seasonal) Mold Control . Use air conditioning and keep windows closed. . Avoid exposure to decaying vegetation. Marland Kitchen. Avoid leaf raking. . Avoid grain handling. . Consider wearing a face mask if working in moldy areas.  Indoor (Perennial) Mold Control  . Maintain humidity below 50%. . Get rid of mold growth on hard surfaces with water, detergent and, if necessary, 5% bleach (do not mix with other cleaners). Then dry the area completely. If mold covers an area more than 10 square feet, consider hiring an indoor environmental professional. . For clothing, washing with soap and water is best. If moldy items cannot be cleaned and dried, throw them away. . Remove sources e.g. contaminated  carpets. . Repair and seal leaking roofs or pipes. Using dehumidifiers in damp basements may be helpful, but empty the water and clean units regularly to prevent mildew from forming. All rooms, especially basements, bathrooms and kitchens, require ventilation and cleaning to deter mold and mildew growth. Avoid carpeting on concrete or damp floors, and storing items in damp areas. Cockroach Allergen Avoidance Cockroaches are often found in the homes of densely  populated urban areas, schools or commercial buildings, but these creatures can lurk almost anywhere. This does not mean that you have a dirty house or living area. . Block all areas where roaches can enter the home. This includes crevices, wall cracks and windows.  . Cockroaches need water to survive, so fix and seal all leaky faucets and pipes. Have an exterminator go through the house when your family and pets are gone to eliminate any remaining roaches. Marland Kitchen Keep food in lidded containers and put pet food dishes away after your pets are done eating. Vacuum and sweep the floor after meals, and take out garbage and recyclables. Use lidded garbage containers in the kitchen. Wash dishes immediately after use and clean under stoves, refrigerators or toasters where crumbs can accumulate. Wipe off the stove and other kitchen surfaces and cupboards regularly.

## 2019-08-05 NOTE — Assessment & Plan Note (Signed)
Reaction to peanuts at age 23 requiring ER visit and to shrimp requiring ER visit. Last reaction in February with lip swelling which was treated with Pepcid and benadryl x 2. No known exposure at that time but concerned about potential peanut exposure. 2016 skin testing was positive to tree nuts.  Today's skin testing showed: Positive to peanuts and tree nuts. Negative to seafood/shellfish.  Food allergen skin testing has excellent negative predictive value however there is still a 5% chance that the allergy exists. Therefore, we will investigate further with serum specific IgE levels and, if negative then schedule for open graded oral food challenge. A laboratory order form has been provided for serum specific IgE against peanuts, tree nuts, seafood and shellfish. Until the food allergy has been definitively ruled out, the patient is to continue meticulous avoidance of peanuts, tree nuts, seafood and shellfish and have access to epinephrine autoinjector 2 pack.  I have prescribed epinephrine injectable and demonstrated proper use. For mild symptoms you can take over the counter antihistamines such as Benadryl and monitor symptoms closely. If symptoms worsen or if you have severe symptoms including breathing issues, throat closure, significant swelling, whole body hives, severe diarrhea and vomiting, lightheadedness then inject epinephrine and seek immediate medical care afterwards.  Food action plan given.

## 2019-08-05 NOTE — Assessment & Plan Note (Signed)
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 Using Claritin prn and Flonase prn with good benefit. 2016 skin testing was positive to grass, ragweed, weed, molds, cat, dog, cockroach, dust mites.  Start environmental control measures.   May use over the counter antihistamines such as Zyrtec (cetirizine), Claritin (loratadine), Allegra (fexofenadine), or Xyzal (levocetirizine) daily as needed.  May use Flonase 1 spray twice a day as needed for sinus issues.  If above regimen does not control symptoms then recommend repeating skin testing and starting allergy injections.

## 2019-08-05 NOTE — Progress Notes (Signed)
New Patient Note  RE: Mark Juarez MRN: [id number] DOB: 12/13/1996 Date of Office Visit: 08/05/2019  Referring provider: Pearline Cablesopland, Jessica C, MD Primary care provider: Pearline Cablesopland, Jessica C, MD  Chief Complaint: Establish Care (shellfish testing)  History of Present Illness: I had the pleasure of seeing Mark Juarez for initial evaluation at the [facility] on 08/05/2019. He is a 23 y.o. male, who is self-referred here for reestablishing care. Patient was seen by Dr. Willa RoughHicks in 2016 for asthma, allergic rhinoconjunctivitis and food allergies. He is accompanied by his mother.   Allergic rhinoconjunctivitis: He reports symptoms of sneezing, nasal congestion, rhinorrhea, itchy/watery eyes. Symptoms have been going on for 10+ years. The symptoms are present during spring through fall. Other triggers include exposure to pollen. Anosmia: no. Headache: no. He has used Flonase prn, Claritin prn with fair improvement in symptoms. Sinus infections: one recently. Previous work up includes: 2016 skin testing was positive to grass, ragweed, weed, molds, cat, dog, cockroach, dust mites. No previous allergy injections.  Previous ENT evaluation: no. Last eye exam: February 2020.  Asthma: He reports symptoms of chest tightness, shortness of breath, coughing, wheezing  for 15+ years but better now. Current medications include albuterol prn which help. He reports not using aerochamber with asthma inhalers. He tried the following inhalers: albuterol neb. Main asthma triggers are allergies, infections, exercise. In the last month, frequency of asthma symptoms: <1x/week. Frequency of nocturnal symptoms: 0x/month. Frequency of SABA use: <1x/week. Interference with physical activity: no. Sleep is undisturbed. In the last 12 months, emergency room visits/urgent care visits/doctor office visits or hospitalizations due to asthma: 0. In the last 12 months, oral steroids courses: 0. Lifetime history of  hospitalization for asthma: 0. Prior intubations: 0. History of pneumonia: no. He was evaluated by allergist in the past. Smoking exposure: denies. Up to date with flu vaccine: yes. History of reflux: yes sometimes after eating spicy foods. Recently was prescribed Qvar but has not picked it up yet.   Food allergy: He reports food allergy to tree nuts, peanuts, seafood, shellfish.  Tree nuts/peanuts: The reaction occurred at the age of 4, after he ate small amount of peanut butter at daycare. Symptoms started within minutes and was in the form of facial swelling, tongue swelling. The symptoms lasted for a few hours. He was evaluated in ED and received epinephrine, benadryl, steroids, Pepcid.   Seafood/shellfish: The reaction occurred at the age of 23, after he ate shrimp. Symptoms started within minutes and was in the form or lip angioedema. The symptoms lasted for 24 hours after Epinephrine.   Last reaction was in February with lip swelling. Treated with Pepcid and benadryl x 2. No known exposures but mother concerned about potential peanut cross contamination at home as brother likes to consume peanuts at home.   He does have access to epinephrine autoinjector.  Past work up includes: 2016 skin testing was positive to tree nuts. Dietary History: patient has been eating other foods including milk, eggs, sesame, soy, wheat, meats, fruits and vegetables.  He reports reading labels and avoiding peanuts, tree nuts, seafood, shellfish in diet completely.   Assessment and Plan: Mark Juarez is a 23 y.o. 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 Last reaction in February with lip swelling which was treated with Pepcid and benadryl x 2. No known exposure at that time but concerned about potential peanut  exposure. 2016 skin testing was positive to tree nuts.  Today's skin testing showed: Positive to peanuts  and tree nuts. Negative to seafood/shellfish.  Food allergen skin testing has excellent negative predictive value however there is still a 5% chance that the allergy exists. Therefore, we will investigate further with serum specific IgE levels and, if negative then schedule for open graded oral food challenge. A laboratory order form has been provided for serum specific IgE against peanuts, tree nuts, seafood and shellfish. Until the food allergy has been definitively ruled out, the patient is to continue meticulous avoidance of peanuts, tree nuts, seafood and shellfish and have access to epinephrine autoinjector 2 pack.  I have prescribed epinephrine injectable and demonstrated proper use. For mild symptoms you can take over the counter antihistamines such as Benadryl and monitor symptoms closely. If symptoms worsen or if you have severe symptoms including breathing issues, throat closure, significant swelling, whole body hives, severe diarrhea and vomiting, lightheadedness then inject epinephrine and seek immediate medical care afterwards.  Food action plan given.   Mild intermittent asthma without complication Diagnosed with asthma over 15+ years ago. Main triggers include infections, allergies and exertion. Using albuterol less than once a week but mainly spends time indoors. PCP recently prescribed Qvar (unknown dose) but has not picked up yet.   Today's spirometry was normal. . Daily controller medication(s): start Singulair 10mg  daily o Cautioned that in some children/adults can experience behavioral changes including hyperactivity, agitation, depression, sleep disturbances and suicidal ideations. These side effects are rare, but if you notice them you should notify me and discontinue Singulair (montelukast). . Prior to physical activity: May use albuterol rescue inhaler 2 puffs 5 to 15 minutes prior to strenuous physical activities. Marland Kitchen. Rescue medications: May use albuterol rescue inhaler 2  puffs or nebulizer every 4 to 6 hours as needed for shortness of breath, chest tightness, coughing, and wheezing. Monitor frequency of use.  . During upper respiratory infections: Start Qvar 2 puffs twice a day for 1-2 weeks and rinse mouth afterwards.  Seasonal and perennial allergic rhinoconjunctivitis Rhino conjunctivitis symptoms for the past 10+ years from spring through fall. Using Claritin prn and Flonase prn with good benefit. 2016 skin testing was positive to grass, ragweed, weed, molds, cat, dog, cockroach, dust mites.  Start environmental control measures.   May use over the counter antihistamines such as Zyrtec (cetirizine), Claritin (loratadine), Allegra (fexofenadine), or Xyzal (levocetirizine) daily as needed.  May use Flonase 1 spray twice a day as needed for sinus issues.  If above regimen does not control symptoms then recommend repeating skin testing and starting allergy injections.  Return in about 3 months (around 11/05/2019).  Meds ordered this encounter  Medications  . EPINEPHrine (AUVI-Q) 0.3 mg/0.3 mL IJ SOAJ injection    Sig: Inject 0.3 mLs (0.3 mg total) into the muscle once for 1 dose. As directed for life-threatening allergic reactions    Dispense:  2 each    Refill:  2  . montelukast (SINGULAIR) 10 MG tablet    Sig: Take 1 tablet (10 mg total) by mouth at bedtime.    Dispense:  30 tablet    Refill:  5    Lab Orders     Allergens(7)     IgE Peanut Component Profile     Allergy Panel 19, Seafood Group     Allergen Profile, Shellfish  Other allergy screening: Medication allergy: yes  Penicillin  - swelling Hymenoptera allergy: no Urticaria: no Eczema: improved History of  recurrent infections suggestive of immunodeficency: no  Diagnostics: Spirometry:  Tracings reviewed. His effort: Good reproducible efforts. FVC: 3.98L FEV1: 2.99L, 81% predicted FEV1/FVC ratio: 75% Interpretation: Spirometry consistent with normal pattern.  Please see  scanned spirometry results for details.  Skin Testing: Select foods. Positive test to: peanuts and tree nuts. Negative test to: seafood/shellfish.  Results discussed with patient/family. Food Adult Perc - 08/05/19 1000    Time Antigen Placed  1025    Allergen Manufacturer  Lavella Hammock    Location  Back    Number of allergen test  23     Control-buffer 50% Glycerol  Negative    Control-Histamine 1 mg/ml  2+    1. Peanut  --   6x5   8. Shellfish Mix  Negative    9. Fish Mix  Negative    10. Cashew  --   +/-   11. Pecan Food  Negative    12. Gem  --   6x5   13. Almond  --   4x4   14. Hazelnut  Negative    15. [place] nut  --   +/-   16. Coconut  Negative    17. Pistachio  Negative    18. Catfish  Negative    19. Bass  Negative    20. Trout  Negative    21. Tuna  Negative    22. Salmon  Negative    23. Flounder  Negative    24. Codfish  Negative    25. Shrimp  Negative    26. Crab  Negative    27. Lobster  Negative    28. Oyster  Negative    29. Scallops  Negative       Past Medical History: Patient Active Problem List   Diagnosis Date Noted  . 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 Mild intermittent asthma without complication 52/77/8242  . Seasonal and perennial allergic rhinoconjunctivitis 08/05/2019   Past Medical History:  Diagnosis Date  . Allergy   . Asthma   . Eczema   . Seasonal allergies   . Urticaria    Past Surgical History: Past Surgical History:  Procedure Laterality Date  . CIRCUMCISION    . URETHRA SURGERY     Medication List:  Current Outpatient Medications  Medication Sig Dispense Refill  . albuterol (PROVENTIL) (2.5 MG/3ML) 0.083% nebulizer solution Take 3 mLs (2.5 mg total) by nebulization every 6 (six) hours as needed for wheezing or shortness of breath. 150 mL 0  . fluticasone (FLONASE) 50 MCG/ACT nasal spray Place 2 sprays into both nostrils daily. 16 g 6  . loratadine (CLARITIN) 10 MG tablet Take 10 mg by  mouth daily.    Marland Kitchen PROAIR HFA 108 (90 Base) MCG/ACT inhaler Inhale 2 puffs into the lungs every 6 (six) hours as needed for wheezing or shortness of breath. 18 g 5  . EPINEPHrine (AUVI-Q) 0.3 mg/0.3 mL IJ SOAJ injection Inject 0.3 mLs (0.3 mg total) into the muscle once for 1 dose. As directed for life-threatening allergic reactions 2 each 2  . montelukast (SINGULAIR) 10 MG tablet Take 1 tablet (10 mg total) by mouth at bedtime. 30 tablet 5   No current facility-administered medications for this visit.    Allergies: Allergies  Allergen Reactions  . Peanuts [Peanut Oil] Anaphylaxis  . Penicillins Swelling   Social History: Social History   Socioeconomic History  . Marital status: Single    Spouse name: Not on file  .  Number of children: Not on file  . Years of education: Not on file  . Highest education level: Not on file  Occupational History  . Not on file  Social Needs  . Financial resource strain: Not on file  . Food insecurity    Worry: Not on file    Inability: Not on file  . Transportation needs    Medical: Not on file    Non-medical: Not on file  Tobacco Use  . Smoking status: Never Smoker  . Smokeless tobacco: Never Used  Substance and Sexual Activity  . Alcohol use: No  . Drug use: No  . Sexual activity: Not on file  Lifestyle  . Physical activity    Days per week: Not on file    Minutes per session: Not on file  . Stress: Not on file  Relationships  . Social Musician on phone: Not on file    Gets together: Not on file    Attends religious service: Not on file    Active member of club or organization: Not on file    Attends meetings of clubs or organizations: Not on file    Relationship status: Not on file  Other Topics Concern  . Not on file  Social History Narrative  . Not on file   Lives in a townhouse. Smoking: denies Occupation: Press photographer History: Water Damage/mildew in the house: yes but they fixed it.  Carpet in  the family room: yes Carpet in the bedroom: yes Heating: electric Cooling: central Pet: no  Family History: Family History  Problem Relation Age of Onset  . Diabetes Mother   . Hypertension Mother   . Hyperlipidemia Mother   . Heart attack Mother   . Heart disease Mother   . Allergic rhinitis Mother   . Urticaria Mother   . Eczema Mother   . Diabetes Father   . Hypertension Father   . Hyperlipidemia Father   . Allergic rhinitis Father   . Urticaria Father   . Allergic rhinitis Brother   . Asthma Neg Hx    Review of Systems  Constitutional: Negative for appetite change, chills, fever and unexpected weight change.  HENT: Negative for congestion and rhinorrhea.   Eyes: Negative for itching.  Respiratory: Negative for cough, chest tightness, shortness of breath and wheezing.   Cardiovascular: Negative for chest pain.  Gastrointestinal: Negative for abdominal pain.  Genitourinary: Negative for difficulty urinating.  Skin: Negative for rash.  Allergic/Immunologic: Positive for environmental allergies and food allergies.  Neurological: Negative for headaches.   Objective: BP 108/84 (BP Location: Left Arm, Patient Position: Sitting, Cuff Size: Normal)   Pulse 70   Temp 98 F (36.7 C) (Temporal)   Ht 5' 7.5" (1.715 m)   Wt 154 lb (69.9 kg)   SpO2 98%   BMI 23.76 kg/m  Body mass index is 23.76 kg/m. Physical Exam  Constitutional: He is oriented to person, place, and time. He appears well-developed and well-nourished.  HENT:  Head: Normocephalic and atraumatic.  Right Ear: External ear normal.  Left Ear: External ear normal.  Nose: Nose normal.  Mouth/Throat: Oropharynx is clear and moist.  Eyes: Conjunctivae and EOM are normal.  Neck: Neck supple.  Cardiovascular: Normal rate, regular rhythm and normal heart sounds. Exam reveals no gallop and no friction rub.  No murmur heard. Pulmonary/Chest: Effort normal and breath sounds normal. He has no wheezes. He has no  rales.  Abdominal: Soft.  Neurological: He is  alert and oriented to person, place, and time.  Skin: Skin is warm. No rash noted.  Psychiatric: He has a normal mood and affect. His behavior is normal.  Nursing note and vitals reviewed.  The plan was reviewed with the patient/family, and all questions/concerned were addressed.  It was my pleasure to see Mark Juarez today and participate in his care. Please feel free to contact me with any questions or concerns.  Sincerely,  Wyline MoodYoon Asheton Viramontes, DO Allergy & Immunology  [facility] [place] office: [phone] [facility] office: [phone] [place] office: [phone]

## 2019-08-07 LAB — ALLERGENS(7)
Brazil Nut IgE: 6.11 kU/L — AB
F020-IgE Almond: 13.5 kU/L — AB
F202-IgE Cashew Nut: 3.3 kU/L — AB
Hazelnut (Filbert) IgE: 8.48 kU/L — AB
Peanut IgE: >100 kU/L — AB
Pecan Nut IgE: 1.44 kU/L — AB
Walnut IgE: 4.51 kU/L — AB

## 2019-08-07 LAB — IGE PEANUT COMPONENT PROFILE
F352-IgE Ara h 8: <0.1 kU/L
F422-IgE Ara h 1: >100 kU/L — AB
F423-IgE Ara h 2: >100 kU/L — AB
F424-IgE Ara h 3: >100 kU/L — AB
F427-IgE Ara h 9: <0.1 kU/L
F447-IgE Ara h 6: 39.4 kU/L — AB

## 2019-08-07 LAB — ALLERGY PANEL 19, SEAFOOD GROUP
Allergen Salmon IgE: 1.34 kU/L — AB
Catfish: <0.1 kU/L
Codfish IgE: 0.21 kU/L — AB
F023-IgE Crab: 1.67 kU/L — AB
F080-IgE Lobster: 0.48 kU/L — AB
Shrimp IgE: 1.32 kU/L — AB
Tuna: 0.65 kU/L — AB

## 2019-08-07 LAB — ALLERGEN PROFILE, SHELLFISH
Clam IgE: 0.18 kU/L — AB
F290-IgE Oyster: <0.1 kU/L
Scallop IgE: 0.11 kU/L — AB

## 2019-08-10 ENCOUNTER — Encounter: Payer: Self-pay | Admitting: Allergy

## 2019-08-11 NOTE — Progress Notes (Addendum)
[facility] [address], [place], [place] [address] [phone]  Date:  08/12/2019   Name:  Mark Juarez   DOB:  [birth year]   MRN:  [id number]  PCP:  Darreld Mclean, MD    Chief Complaint: Annual Exam (flu shot)   History of Present Illness:  Mark Juarez is a 23 y.o. very pleasant male patient who presents with the following:  Here today for a CPE History of mild asthma, allergies Seen by allergy and asthma earlier this month for evaluation -he tested positive for multiple food allergies, mostly nuts and seafood He is still undergoing evaluation for his food allergies - they have not started shots for him, it looks like they may do some sort of food challenge His asthma is getting better  He is using Singulair and Claritin He may use the albuterol if he is ill -otherwise does not generally need it He does have access to an EpiPen  He was recently tested for covid and was negative   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  He is studying business.  He thinks that things are going smoothly with online learning this semester  Flu shot today  He is overall feeling well He lives with his family In his free time he likes to exercise, watch movies  He has noted some left shoulder pain especially he is doing push-ups, for the last few weeks.  No acute injury.  He does not generally do [employer] or bench press  He is planning to get his driver license   We went over his immunizations today.  The only shots registered with the state were given at age 89 to 23 years old.  I am sure he has had more shots, we just do not have record of them.  Gave third Gardasil and flu shot today.  Patient Active Problem List   Diagnosis Date Noted  . 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 Mild intermittent asthma without complication 76/54/6503  . Seasonal and perennial allergic  rhinoconjunctivitis 08/05/2019    Past Medical History:  Diagnosis Date  . Allergy   . Asthma   . Eczema   . Seasonal allergies   . Urticaria     Past Surgical History:  Procedure Laterality Date  . CIRCUMCISION    . URETHRA SURGERY      Social History   Tobacco Use  . Smoking status: Never Smoker  . Smokeless tobacco: Never Used  Substance Use Topics  . Alcohol use: No  . Drug use: No    Family History  Problem Relation Age of Onset  . Diabetes Mother   . Hypertension Mother   . Hyperlipidemia Mother   . Heart attack Mother   . Heart disease Mother   . Allergic rhinitis Mother   . Urticaria Mother   . Eczema Mother   . Diabetes Father   . Hypertension Father   . Hyperlipidemia Father   . Allergic rhinitis Father   . Urticaria Father   . Allergic rhinitis Brother   . Asthma Neg Hx     Allergies  Allergen Reactions  . Peanuts [Peanut Oil] Anaphylaxis  . Penicillins Swelling    Medication list has been reviewed and updated.  Current Outpatient Medications on File Prior to Visit  Medication Sig Dispense Refill  . albuterol (PROVENTIL) (2.5 MG/3ML) 0.083% nebulizer solution  Take 3 mLs (2.5 mg total) by nebulization every 6 (six) hours as needed for wheezing or shortness of breath. 150 mL 0  . fluticasone (FLONASE) 50 MCG/ACT nasal spray Place into both nostrils daily.    Marland Kitchen loratadine (CLARITIN) 10 MG tablet Take 10 mg by mouth daily.    . montelukast (SINGULAIR) 10 MG tablet Take 1 tablet (10 mg total) by mouth at bedtime. 30 tablet 5  . PROAIR HFA 108 (90 Base) MCG/ACT inhaler Inhale 2 puffs into the lungs every 6 (six) hours as needed for wheezing or shortness of breath. 18 g 5   No current facility-administered medications on file prior to visit.     Review of Systems:  As per HPI- otherwise negative. No chest pain or shortness of breath, no fever or chills, no depression symptoms  Physical Examination: Vitals:   08/12/19 1410  BP: 110/80   Pulse: 87  Resp: 16  Temp: 97.8 F (36.6 C)  SpO2: 99%   Vitals:   08/12/19 1410  Weight: 153 lb (69.4 kg)  Height: 5' 7.5" (1.715 m)   Body mass index is 23.61 kg/m. Ideal Body Weight: Weight in (lb) to have BMI = 25: 161.7  GEN: WDWN, NAD, Non-toxic, A & O x 3, well-appearing young man, normal weight HEENT: Atraumatic, Normocephalic. Neck supple. No masses, No LAD. Ears and Nose: No external deformity. CV: RRR, No M/G/R. No JVD. No thrill. No extra heart sounds. PULM: CTA B, no wheezes, crackles, rhonchi. No retractions. No resp. distress. No accessory muscle use. ABD: S, NT, ND, +BS. No rebound. No HSM. EXTR: No c/c/e NEURO Normal gait.  PSYCH: Normally interactive. Conversant. Not depressed or anxious appearing.  Calm demeanor.  Left shoulder displays full strength and range of motion.  He does have minor tenderness at the left rotator cuff insertion at the proximal humerus   Assessment and Plan:   ICD-10-CM   1. Physical exam  Z00.00   2. Immunization due  Z23 CANCELED: HPV 9-valent vaccine,Recombinat  3. Screening for deficiency anemia  Z13.0 CBC  4. Screening for diabetes mellitus  Z13.1 Basic metabolic panel  5. Routine screening for STI (sexually transmitted infection)  Z11.3 HIV Antibody (routine testing w rflx)    Hepatitis C antibody    Hepatitis B surface antigen    Hepatitis B surface antibody,quantitative    RPR   Here today for complete physical Labs pending as below-he would like routine STI screening today Today flu shot and third Gardasil immunization Went over health maintenance instructions Gave handout regarding rotator cuff tendinitis rehab exercises Will plan further follow- up pending labs.   Follow-up: No follow-ups on file.  No orders of the defined types were placed in this encounter.  Orders Placed This Encounter  Procedures  . Flu Vaccine QUAD 6+ mos PF IM (Fluarix Quad PF)  . HPV 9-valent vaccine,Recombinat  . Basic metabolic  panel  . CBC  . HIV Antibody (routine testing w rflx)  . Hepatitis C antibody  . Hepatitis B surface antigen  . Hepatitis B surface antibody,quantitative  . RPR    @SIGN @    Signed Abbe Amsterdam, MD  Received his labs 8/27, message to patient Results for orders placed or performed in visit on 08/12/19  Basic metabolic panel  Result Value Ref Range   Sodium 140 135 - 145 mEq/L   Potassium 4.2 3.5 - 5.1 mEq/L   Chloride 104 96 - 112 mEq/L   CO2 28 19 - 32 mEq/L  Glucose, Bld 88 70 - 99 mg/dL   BUN 15 6 - 23 mg/dL   Creatinine, Ser 1.610.99 0.40 - 1.50 mg/dL   Calcium 9.9 8.4 - 09.610.5 mg/dL   GFR 045.40115.29 >98.11>60.00 mL/min  CBC  Result Value Ref Range   WBC 3.6 (L) 4.0 - 10.5 K/uL   RBC 5.45 4.22 - 5.81 Mil/uL   Platelets 216.0 150.0 - 400.0 K/uL   Hemoglobin 13.3 13.0 - 17.0 g/dL   HCT 91.441.9 78.239.0 - 95.652.0 %   MCV 76.8 (L) 78.0 - 100.0 fl   MCHC 31.8 30.0 - 36.0 g/dL   RDW 21.314.7 08.611.5 - 57.815.5 %  HIV Antibody (routine testing w rflx)  Result Value Ref Range   HIV 1&2 Ab, 4th Generation NON-REACTIVE NON-REACTI  Hepatitis C antibody  Result Value Ref Range   Hepatitis C Ab NON-REACTIVE NON-REACTI   SIGNAL TO CUT-OFF 0.01 <1.00  Hepatitis B surface antigen  Result Value Ref Range   Hepatitis B Surface Ag NON-REACTIVE NON-REACTI  Hepatitis B surface antibody,quantitative  Result Value Ref Range   Hepatitis B-Post <5 (L) > OR = 10 mIU/mL  RPR  Result Value Ref Range   RPR Ser Ql NON-REACTIVE NON-REACTI

## 2019-08-12 ENCOUNTER — Ambulatory Visit (INDEPENDENT_AMBULATORY_CARE_PROVIDER_SITE_OTHER): Payer: PRIVATE HEALTH INSURANCE | Admitting: Family Medicine

## 2019-08-12 ENCOUNTER — Other Ambulatory Visit: Payer: Self-pay

## 2019-08-12 ENCOUNTER — Encounter: Payer: Self-pay | Admitting: Family Medicine

## 2019-08-12 VITALS — BP 110/80 | HR 87 | Temp 97.8°F | Resp 16 | Ht 67.5 in | Wt 153.0 lb

## 2019-08-12 DIAGNOSIS — Z130 Encounter for screening for diseases of the blood and blood-forming organs and certain disorders involving the immune mechanism: Secondary | ICD-10-CM

## 2019-08-12 DIAGNOSIS — Z131 Encounter for screening for diabetes mellitus: Secondary | ICD-10-CM

## 2019-08-12 DIAGNOSIS — Z23 Encounter for immunization: Secondary | ICD-10-CM | POA: Diagnosis not present

## 2019-08-12 DIAGNOSIS — Z0000 Encounter for general adult medical examination without abnormal findings: Secondary | ICD-10-CM

## 2019-08-12 DIAGNOSIS — Z113 Encounter for screening for infections with a predominantly sexual mode of transmission: Secondary | ICD-10-CM

## 2019-08-12 NOTE — Patient Instructions (Addendum)
It was good to see you today!  You got your 3rd dose of Garadasil vaccine and your annual flu shot.    I gave you a copy of the shots we have on the [place] registry today; however I think you had some shots that were not registered in your pre-teen years (meningiits, tetanus)- please see if you can find these records so we can fill in the gaps  Please continue to exercise and eat a healthy diet.  I think you have strained your right shoulder- rotator cuff tendonitis, to be exact.  Please work on the exercises that I gave you in your hand out.  Let me know if not getting better    Preventive Care 21-21 Years Old, Male Preventive care refers to lifestyle choices and visits with your health care provider that can promote health and wellness. At this stage in your life, you may start seeing a primary care physician instead of a pediatrician. Your health care is now your responsibility. Preventive care for young adults includes:  A yearly physical exam. This is also called an annual wellness visit.  Regular dental and eye exams.  Immunizations.  Screening for certain conditions.  Healthy lifestyle choices, such as diet and exercise. What can I expect for my preventive care visit? Physical exam Your health care provider may check:  Height and weight. These may be used to calculate body mass index (BMI), which is a measurement that tells if you are at a healthy weight.  Heart rate and blood pressure.  Body temperature. Counseling Your health care provider may ask you questions about:  Past medical problems and family medical history.  Alcohol, tobacco, and drug use.  Home and relationship well-being.  Access to firearms.  Emotional well-being.  Diet, exercise, and sleep habits.  Sexual activity and sexual health. What immunizations do I need?  Influenza (flu) vaccine  This is recommended every year. Tetanus, diphtheria, and pertussis (Tdap) vaccine  You may need a Td  booster every 10 years. Varicella (chickenpox) vaccine  You may need this vaccine if you have not already been vaccinated. Human papillomavirus (HPV) vaccine  If recommended by your health care provider, you may need three doses over 6 months. Measles, mumps, and rubella (MMR) vaccine  You may need at least one dose of MMR. You may also need a second dose. Meningococcal conjugate (MenACWY) vaccine  One dose is recommended if you are 21-21 years old and a Market researcher living in a residence hall, or if you have one of several medical conditions. You may also need additional booster doses. Pneumococcal conjugate (PCV13) vaccine  You may need this if you have certain conditions and were not previously vaccinated. Pneumococcal polysaccharide (PPSV23) vaccine  You may need one or two doses if you smoke cigarettes or if you have certain conditions. Hepatitis A vaccine  You may need this if you have certain conditions or if you travel or work in places where you may be exposed to hepatitis A. Hepatitis B vaccine  You may need this if you have certain conditions or if you travel or work in places where you may be exposed to hepatitis B. Haemophilus influenzae type b (Hib) vaccine  You may need this if you have certain risk factors. You may receive vaccines as individual doses or as more than one vaccine together in one shot (combination vaccines). Talk with your health care provider about the risks and benefits of combination vaccines. What tests do I need?  Blood tests  Lipid and cholesterol levels. These may be checked every 5 years starting at age 21.  Hepatitis C test.  Hepatitis B test. Screening  Genital exam to check for testicular cancer or hernias.  Sexually transmitted disease (STD) testing, if you are at risk. Other tests  Tuberculosis skin test.  Vision and hearing tests.  Skin exam. Follow these instructions at home: Eating and drinking   Eat a  diet that includes fresh fruits and vegetables, whole grains, lean protein, and low-fat dairy products.  Drink enough fluid to keep your urine pale yellow.  Do not drink alcohol if: ? Your health care provider tells you not to drink. ? You are under the legal drinking age. In the [place] the legal drinking age is 58.  If you drink alcohol: ? Limit how much you have to 0-2 drinks a day. ? Be aware of how much alcohol is in your drink. In the [place] one drink equals one 12 oz bottle of beer (355 mL), one 5 oz glass of wine (148 mL), or one 1 oz glass of hard liquor (44 mL). Lifestyle  Take daily care of your teeth and gums.  Stay active. Exercise at least 30 minutes 5 or more days of the week.  Do not use any products that contain nicotine or tobacco, such as cigarettes, e-cigarettes, and chewing tobacco. If you need help quitting, ask your health care provider.  Do not use drugs.  If you are sexually active, practice safe sex. Use a condom or other form of protection to prevent STIs (sexually transmitted infections).  Find healthy ways to cope with stress, such as: ? Meditation, yoga, or listening to music. ? Journaling. ? Talking to a trusted person. ? Spending time with friends and family. Safety  Always wear your seat belt while driving or riding in a vehicle.  Do not drive if you have been drinking alcohol.  Do not ride with someone who has been drinking.  Do not drive when you are tired or distracted.  Do not text while driving.  Wear a helmet and other protective equipment during sports activities.  If you have firearms in your house, make sure you follow all gun safety procedures.  Seek help if you have been bullied, physically abused, or sexually abused.  Use the Internet responsibly to avoid dangers such as online bullying and online sex predators. What's next?  Go to your health care provider once a year for a well check visit.  Ask your health care provider  how often you should have your eyes and teeth checked.  Stay up to date on all vaccines. This information is not intended to replace advice given to you by your health care provider. Make sure you discuss any questions you have with your health care provider. Document Released: 04/19/2016 Document Revised: 11/27/2018 Document Reviewed: 11/27/2018 Elsevier Patient Education  2020 [employer].

## 2019-08-13 ENCOUNTER — Encounter: Payer: Self-pay | Admitting: Family Medicine

## 2019-08-13 LAB — CBC
HCT: 41.9 % (ref 39.0–52.0)
Hemoglobin: 13.3 g/dL (ref 13.0–17.0)
MCHC: 31.8 g/dL (ref 30.0–36.0)
MCV: 76.8 fl — ABNORMAL LOW (ref 78.0–100.0)
Platelets: 216 10*3/uL (ref 150.0–400.0)
RBC: 5.45 Mil/uL (ref 4.22–5.81)
RDW: 14.7 % (ref 11.5–15.5)
WBC: 3.6 10*3/uL — ABNORMAL LOW (ref 4.0–10.5)

## 2019-08-13 LAB — RPR: RPR Ser Ql: NONREACTIVE

## 2019-08-13 LAB — BASIC METABOLIC PANEL
BUN: 15 mg/dL (ref 6–23)
CO2: 28 mEq/L (ref 19–32)
Calcium: 9.9 mg/dL (ref 8.4–10.5)
Chloride: 104 mEq/L (ref 96–112)
Creatinine, Ser: 0.99 mg/dL (ref 0.40–1.50)
GFR: 115.29 mL/min (ref >60.00)
Glucose, Bld: 88 mg/dL (ref 70–99)
Potassium: 4.2 mEq/L (ref 3.5–5.1)
Sodium: 140 mEq/L (ref 135–145)

## 2019-08-13 LAB — HEPATITIS C ANTIBODY
Hepatitis C Ab: NONREACTIVE
SIGNAL TO CUT-OFF: 0.01 (ref <1.00)

## 2019-08-13 LAB — HEPATITIS B SURFACE ANTIBODY, QUANTITATIVE: Hep B S AB Quant (Post): <5 m[IU]/mL — ABNORMAL LOW (ref >=10)

## 2019-08-13 LAB — HIV ANTIBODY (ROUTINE TESTING W REFLEX): HIV 1&2 Ab, 4th Generation: NONREACTIVE

## 2019-08-13 LAB — HEPATITIS B SURFACE ANTIGEN: Hepatitis B Surface Ag: NONREACTIVE

## 2020-04-08 ENCOUNTER — Encounter: Payer: Self-pay | Admitting: Family Medicine

## 2020-04-13 ENCOUNTER — Inpatient Hospital Stay
Admission: RE | Admit: 2020-04-13 | Discharge: 2020-04-13 | Disposition: A | Discharge status: Home / Self Care | Payer: PRIVATE HEALTH INSURANCE | Source: Ambulatory Visit

## 2020-04-13 NOTE — ED Provider Notes (Signed)
[facility]    CSN: [id number] Arrival date & time:         History   Chief Complaint No chief complaint on file.   HPI Mark Juarez is a 24 y.o. male.   This visit was conducted via audio video media.  This was in conjunction with Covid distancing guidelines.  Nurse practitioner Moshe Cipro was in the [place] urgent care office, patient was at home during this visit.  Patient informed that there are some things that he cannot address over video visit, he may need to be seen in person.  Patient agrees to continue with visit.  Due to the nature of this visit, physical exam was unable to be perfomed.  Reports that he has been experiencing nasal congestion.  Reports that he takes Claritin and Flonase every day.  Reports that he has been having yellow nasal discharge for the last 2 days.  Has not taken any decongestants.  Denies headache, cough, shortness of breath, nausea, vomiting, diarrhea, rash, fever, other symptoms.  Per chart review, patient has medical history significant for mild intermittent asthma.  Not taking daily medications for this at the time.  Does report that he has inhaler at home if he needs it.  ROS per HPI  The history is provided by the patient.    Past Medical History:  Diagnosis Date  . Allergy   . Asthma   . Eczema   . Seasonal allergies   . Urticaria     Patient Active Problem List   Diagnosis Date Noted  . 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 Mild intermittent asthma without complication 08/05/2019  . Seasonal and perennial allergic rhinoconjunctivitis 08/05/2019    Past Surgical History:  Procedure Laterality Date  . CIRCUMCISION    . URETHRA SURGERY         Home Medications    Prior to Admission medications   Medication Sig Start Date End Date Taking? Authorizing Provider  albuterol (PROVENTIL) (2.5 MG/3ML) 0.083% nebulizer solution Take 3 mLs (2.5 mg total) by nebulization every 6 (six)  hours as needed for wheezing or shortness of breath. 07/23/19   Copland, Gwenlyn Found, MD  fluticasone (FLONASE) 50 MCG/ACT nasal spray Place into both nostrils daily.    [provider]  loratadine (CLARITIN) 10 MG tablet Take 10 mg by mouth daily.    [provider]  montelukast (SINGULAIR) 10 MG tablet Take 1 tablet (10 mg total) by mouth at bedtime. 08/05/19   Ellamae Sia, DO  PROAIR HFA 108 [phone] Base) MCG/ACT inhaler Inhale 2 puffs into the lungs every 6 (six) hours as needed for wheezing or shortness of breath. 07/24/19   Copland, Gwenlyn Found, MD    Family History Family History  Problem Relation Age of Onset  . Diabetes Mother   . Hypertension Mother   . Hyperlipidemia Mother   . Heart attack Mother   . Heart disease Mother   . Allergic rhinitis Mother   . Urticaria Mother   . Eczema Mother   . Diabetes Father   . Hypertension Father   . Hyperlipidemia Father   . Allergic rhinitis Father   . Urticaria Father   . Allergic rhinitis Brother   . Asthma Neg Hx     Social History Social History   Tobacco Use  . Smoking status: Never Smoker  . Smokeless tobacco: Never Used  Substance Use Topics  . Alcohol use: No  . Drug use: No  Allergies   Peanuts [peanut oil] and Penicillins   Review of Systems Review of Systems   Physical Exam Triage Vital Signs ED Triage Vitals  Enc Vitals Group     BP      Pulse      Resp      Temp      Temp src      SpO2      Weight      Height      Head Circumference      Peak Flow      Pain Score      Pain Loc      Pain Edu?      Excl. in [place]?    No data found.  Updated Vital Signs There were no vitals taken for this visit.  Visual Acuity Right Eye Distance:   Left Eye Distance:   Bilateral Distance:    Right Eye Near:   Left Eye Near:    Bilateral Near:     Physical Exam Constitutional:      General: He is not in acute distress.    Appearance: Normal appearance. He is normal weight. He is not  ill-appearing.     Comments: Visual inspection from patient's camera reveals that patient is alert, normal weight and not in acute distress.  HENT:     Head: Normocephalic and atraumatic.  Neurological:     Mental Status: He is alert.      UC Treatments / Results  Labs (all labs ordered are listed, but only abnormal results are displayed) Labs Reviewed - No data to display  EKG   Radiology No results found.  Procedures Procedures (including critical care time)  Medications Ordered in UC Medications - No data to display  Initial Impression / Assessment and Plan / UC Course  I have reviewed the triage vital signs and the nursing notes.  Pertinent labs & imaging results that were available during my care of the patient were reviewed by me and considered in my medical decision making (see chart for details).     Nasal Congestion Allergic Rhinitis: Presents with nasal congestion with yellow discharge for the last 2 days.  Has been taking Claritin and using Flonase.  Denies fever, chills, body aches.  Patient is alert not in acute distress.  Denies wheezing.  Patient did not cough during our audio video visit, so I did not hear what his cough was sounding like.  Discussed with patient that allergic rhinitis is the likely cause.  Discussed with patient that he may want to add an over-the-counter cough syrup if cough is bothering him, like Robitussin.  He may also use an over-the-counter decongestant such as Mucinex.  Patient verbalized understanding.  Patient instructed to follow-up in person with worsening or continuing symptoms.  Verbalized understanding and agreement treatment plan.  Final Clinical Impressions(s) / UC Diagnoses   Final diagnoses:  None   Discharge Instructions   None    ED Prescriptions    None     PDMP not reviewed this encounter.   Faustino Congress, NP 04/13/20 1818

## 2020-05-10 ENCOUNTER — Encounter: Payer: Self-pay | Admitting: Family Medicine

## 2020-05-10 NOTE — Progress Notes (Signed)
[facility] [address] [place], [place] [address] [phone]  Date:  05/11/2020   Name:  Mark Juarez   DOB:  October 07, 1996   MRN:  [id number]  PCP:  Pearline Cables, MD    Chief Complaint: Immunizations (tb skin test and tdap)   History of Present Illness:  Mark Juarez is a 25 y.o. very pleasant male patient who presents with the following:  Generally healthy young man with history of food allergy and mild asthma Here today for school health screening, he needs testing for tuberculosis Last seen by myself in August 2020, at that time he was studying online with [employer] due to the pandemic. 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 Mild intermittent asthma without complication 08/05/2019  . Seasonal and perennial allergic rhinoconjunctivitis 08/05/2019    Past Medical History:  Diagnosis Date  . Allergy   . Asthma   . Eczema   . Seasonal allergies   . Urticaria     Past Surgical History:  Procedure Laterality Date  . CIRCUMCISION    . URETHRA SURGERY      Social History   Tobacco Use  . Smoking status: Never Smoker  . Smokeless tobacco: Never Used  Substance Use Topics  . Alcohol use: No  . Drug use: No    Family History  Problem Relation Age of Onset  . Diabetes Mother   . Hypertension Mother   . Hyperlipidemia Mother   . Heart attack Mother   . Heart disease Mother   . Allergic rhinitis  Mother   . Urticaria Mother   . Eczema Mother   . Diabetes Father   . Hypertension Father   . Hyperlipidemia Father   . Allergic rhinitis Father   . Urticaria Father   . Allergic rhinitis Brother   . Asthma Neg Hx     Allergies  Allergen Reactions  . Peanuts [Peanut Oil] Anaphylaxis  . Penicillins Swelling    Medication list has been reviewed and updated.  Current Outpatient Medications on File Prior to Visit  Medication Sig Dispense Refill  . albuterol (PROVENTIL) (2.5 MG/3ML) 0.083% nebulizer solution Take 3 mLs (2.5 mg total) by nebulization every 6 (six) hours as needed for wheezing or shortness of breath. 150 mL 0  . fluticasone (FLONASE) 50 MCG/ACT nasal spray Place into both nostrils daily.    Marland Kitchen loratadine (CLARITIN) 10 MG tablet Take 10 mg by mouth daily.    . montelukast (SINGULAIR) 10 MG tablet Take 1 tablet (10 mg total) by mouth at bedtime. 30 tablet 5  . PROAIR HFA 108 (90 Base) MCG/ACT inhaler Inhale 2 puffs into the lungs every 6 (six) hours as needed for wheezing or shortness of breath. 18 g 5   No current facility-administered medications on file prior to visit.    Review of Systems:  As per HPI- otherwise negative.  Physical Examination: Vitals:   05/11/20 1604  BP: 111/72  Pulse: 90  Resp: 17  SpO2: 99%   Vitals:   05/11/20 1604  Weight: 169 lb (76.7 kg)  Height: 5\' 7"  (1.702 m)   Body mass index is 26.47 kg/m. Ideal Body Weight: Weight in (lb) to have BMI = 25: 159.3  Patient observed her video monitor.  He looks well, no distress is noted  Pulse Readings from Last 3 Encounters:  05/11/20 90  08/12/19 87  08/05/19 70    Assessment and Plan: Immunization due - Plan: Tdap vaccine greater than or equal to 7yo IM  Screening for tuberculosis - Plan: PPD  Patient is here today for updated immunizations and TB testing for school.  Given dose of Tdap, place PPD He will come back for PPD read in 48 hours Video used for entirety of visit  today This visit occurred during the SARS-CoV-2 public health emergency.  Safety protocols were in place, including screening questions prior to the visit, additional usage of staff PPE, and extensive cleaning of exam room while observing appropriate contact time as indicated for disinfecting solutions.    Signed Lamar Blinks, MD

## 2020-05-11 ENCOUNTER — Telehealth (INDEPENDENT_AMBULATORY_CARE_PROVIDER_SITE_OTHER): Payer: PRIVATE HEALTH INSURANCE | Admitting: Family Medicine

## 2020-05-11 VITALS — BP 111/72 | HR 90 | Resp 17 | Ht 67.0 in | Wt 169.0 lb

## 2020-05-11 DIAGNOSIS — Z111 Encounter for screening for respiratory tuberculosis: Secondary | ICD-10-CM | POA: Diagnosis not present

## 2020-05-11 DIAGNOSIS — Z23 Encounter for immunization: Secondary | ICD-10-CM | POA: Diagnosis not present

## 2020-05-13 ENCOUNTER — Ambulatory Visit: Payer: PRIVATE HEALTH INSURANCE

## 2020-05-13 ENCOUNTER — Encounter: Payer: Self-pay | Admitting: *Deleted

## 2020-05-13 LAB — TB SKIN TEST
Induration: 0 mm
TB Skin Test: NEGATIVE

## 2020-08-15 ENCOUNTER — Encounter: Payer: PRIVATE HEALTH INSURANCE | Admitting: Family Medicine

## 2020-08-28 NOTE — Progress Notes (Deleted)
[name] [address] [place], [place] [address] [phone]  Date:  08/31/2020   Name:  Mark Juarez   DOB:  [birth year]   MRN:  [id number]  PCP:  Pearline Cables, MD    Chief Complaint: No chief complaint on file.   History of Present Illness:  Mark Juarez is a 24 y.o. very pleasant male patient who presents with the following:  Patient with history of food allergy, asthma.  Here today for physical exam  Last seen by myself for virtual visit in May    Patient Active Problem List   Diagnosis Date Noted  . 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 Mild intermittent asthma without complication 08/05/2019  . Seasonal and perennial allergic rhinoconjunctivitis 08/05/2019    Past Medical History:  Diagnosis Date  . Allergy   . Asthma   . Eczema   . Seasonal allergies   . Urticaria     Past Surgical History:  Procedure Laterality Date  . CIRCUMCISION    . URETHRA SURGERY      Social History   Tobacco Use  . Smoking status: Never Smoker  . Smokeless tobacco: Never Used  Vaping Use  . Vaping Use: Never used  Substance Use Topics  . Alcohol use: No  . Drug use: No    Family History  Problem Relation Age of Onset  . Diabetes Mother   . Hypertension Mother   . Hyperlipidemia Mother   . Heart attack Mother   . Heart disease Mother   . Allergic rhinitis Mother   . Urticaria Mother   . Eczema Mother   . Diabetes Father   . Hypertension Father   . Hyperlipidemia Father   . Allergic rhinitis Father   . Urticaria Father   . Allergic rhinitis Brother   . Asthma Neg Hx     Allergies  Allergen Reactions  . Peanuts [Peanut Oil] Anaphylaxis  . Penicillins Swelling    Medication list has been reviewed and updated.  Current Outpatient Medications on File Prior to Visit  Medication Sig Dispense Refill  . albuterol (PROVENTIL) (2.5 MG/3ML) 0.083% nebulizer solution  Take 3 mLs (2.5 mg total) by nebulization every 6 (six) hours as needed for wheezing or shortness of breath. 150 mL 0  . fluticasone (FLONASE) 50 MCG/ACT nasal spray Place into both nostrils daily.    Marland Kitchen loratadine (CLARITIN) 10 MG tablet Take 10 mg by mouth daily.    . montelukast (SINGULAIR) 10 MG tablet Take 1 tablet (10 mg total) by mouth at bedtime. 30 tablet 5  . PROAIR HFA 108 (90 Base) MCG/ACT inhaler Inhale 2 puffs into the lungs every 6 (six) hours as needed for wheezing or shortness of breath. 18 g 5   No current facility-administered medications on file prior to visit.    Review of Systems:  As per HPI- otherwise negative.   Physical Examination: There were no vitals filed for this visit. There were no vitals filed for this visit. There is no height or weight on file to calculate BMI. Ideal Body Weight:    GEN: no acute distress. HEENT: Atraumatic, Normocephalic.  Ears and Nose: No external deformity. CV: RRR, No M/G/R. No JVD. No thrill. No extra heart sounds. PULM: CTA B, no wheezes, crackles, rhonchi. No retractions. No resp. distress. No accessory muscle use. ABD: S, NT, ND, +BS. No rebound. No HSM. EXTR: No c/c/e  PSYCH: Normally interactive. Conversant.    Assessment and Plan: *** This visit occurred during the SARS-CoV-2 public health emergency.  Safety protocols were in place, including screening questions prior to the visit, additional usage of staff PPE, and extensive cleaning of exam room while observing appropriate contact time as indicated for disinfecting solutions.    Signed Lamar Blinks, MD

## 2020-08-31 ENCOUNTER — Encounter: Payer: PRIVATE HEALTH INSURANCE | Admitting: Family Medicine

## 2020-09-11 NOTE — Progress Notes (Addendum)
[facility] [address], [address] [place], [place] [address] [phone]  Date:  09/14/2020   Name:  Mark Juarez   DOB:  1996/05/26   MRN:  [id number]  PCP:  Pearline Cables, MD    Chief Complaint: Annual Exam (flu shot)   History of Present Illness:  Mark Juarez is a 24 y.o. 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  After discussion he would like to start this series as well Would like routine labs and STI screening today  Patient Active Problem List   Diagnosis Date Noted  . 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 Mild intermittent asthma without complication 08/05/2019  . Seasonal and perennial allergic rhinoconjunctivitis 08/05/2019    Past Medical History:  Diagnosis Date  . Allergy   . Asthma   . Eczema   . Seasonal allergies   . Urticaria     Past Surgical History:  Procedure Laterality Date  . CIRCUMCISION    . URETHRA SURGERY      Social History   Tobacco Use  . Smoking status: Never Smoker  . Smokeless tobacco: Never Used  Vaping Use  . Vaping Use: Never used  Substance Use Topics  . Alcohol use: No  . Drug use: No    Family History  Problem  Relation Age of Onset  . Diabetes Mother   . Hypertension Mother   . Hyperlipidemia Mother   . Heart attack Mother   . Heart disease Mother   . Allergic rhinitis Mother   . Urticaria Mother   . Eczema Mother   . Diabetes Father   . Hypertension Father   . Hyperlipidemia Father   . Allergic rhinitis Father   . Urticaria Father   . Allergic rhinitis Brother   . Asthma Neg Hx     Allergies  Allergen Reactions  . Peanuts [Peanut Oil] Anaphylaxis  . Penicillins Swelling    Medication list has been reviewed and updated.  Current Outpatient Medications on File Prior to Visit  Medication Sig Dispense Refill  . albuterol (PROVENTIL) (2.5 MG/3ML) 0.083% nebulizer solution Take 3 mLs (2.5 mg total) by nebulization every 6 (six) hours as needed for wheezing or shortness of breath. 150 mL 0  . fluticasone (FLONASE) 50 MCG/ACT nasal spray Place into both nostrils daily.    Marland Kitchen loratadine (CLARITIN) 10 MG tablet Take 10 mg by mouth daily.    Marland Kitchen PROAIR HFA 108 (90 Base) MCG/ACT inhaler Inhale 2 puffs into the lungs every 6 (six) hours as needed for wheezing  or shortness of breath. 18 g 5   No current facility-administered medications on file prior to visit.    Review of Systems:  As per HPI- otherwise negative.   Physical Examination: Vitals:   09/14/20 1104  BP: 118/82  Pulse: 73  Resp: 16  SpO2: 99%   Vitals:   09/14/20 1104  Weight: 177 lb (80.3 kg)  Height: 5' 7.5" (1.715 m)   Body mass index is 27.31 kg/m. Ideal Body Weight: Weight in (lb) to have BMI = 25: 161.7  GEN: no acute distress.  Well appearing young man  HEENT: Atraumatic, Normocephalic.   Bilateral TM wnl, oropharynx normal.  PEERL,EOMI.   Ears and Nose: No external deformity. CV: RRR, No M/G/R. No JVD. No thrill. No extra heart sounds. PULM: CTA B, no wheezes, crackles, rhonchi. No retractions. No resp. distress. No accessory muscle use. ABD: S, NT, ND, +BS. No rebound. No HSM. EXTR: No c/c/e PSYCH:  Normally interactive. Conversant.    Assessment and Plan: Physical exam  Immunization due - Plan: Flu Vaccine QUAD 6+ mos PF IM (Fluarix Quad PF), Meningococcal B, OMV (Bexsero)  Screening for diabetes mellitus - Plan: Comprehensive metabolic panel, Hemoglobin A1c  Screening for lipid disorders - Plan: Lipid panel  Screening, deficiency anemia, iron - Plan: CBC  Routine screening for STI (sexually transmitted infection) - Plan: Hepatitis B surface antibody,quantitative, Hepatitis B surface antigen, Hepatitis C antibody, HIV Antibody (routine testing w rflx), RPR  Healthy young man here today for CPE Doing well and thriving Labs pending as above Gave flu shot and meningitis B #1 Will plan further follow- up pending labs.   Signed Abbe Amsterdam, MD  addnd 9/30-labs received as below, message to patient  Results for orders placed or performed in visit on 09/14/20  CBC  Result Value Ref Range   WBC 4.1 3.8 - 10.8 Thousand/uL   RBC 5.82 (H) 4.20 - 5.80 Million/uL   Hemoglobin 13.7 13.2 - 17.1 g/dL   HCT 17.6 38 - 50 %   MCV 77.3 (L) 80.0 - 100.0 fL   MCH 23.5 (L) 27.0 - 33.0 pg   MCHC 30.4 (L) 32.0 - 36.0 g/dL   RDW 16.0 73.7 - 10.6 %   Platelets 242 140 - 400 Thousand/uL   MPV 10.8 7.5 - 12.5 fL  Comprehensive metabolic panel  Result Value Ref Range   Glucose, Bld 87 65 - 99 mg/dL   BUN 10 7 - 25 mg/dL   Creat 2.69 4.85 - 4.62 mg/dL   BUN/Creatinine Ratio NOT APPLICABLE 6 - 22 (calc)   Sodium 139 135 - 146 mmol/L   Potassium 4.3 3.5 - 5.3 mmol/L   Chloride 105 98 - 110 mmol/L   CO2 30 20 - 32 mmol/L   Calcium 9.8 8.6 - 10.3 mg/dL   Total Protein 7.5 6.1 - 8.1 g/dL   Albumin 4.4 3.6 - 5.1 g/dL   Globulin 3.1 1.9 - 3.7 g/dL (calc)   AG Ratio 1.4 1.0 - 2.5 (calc)   Total Bilirubin 0.5 0.2 - 1.2 mg/dL   Alkaline phosphatase (APISO) 63 36 - 130 U/L   AST 21 10 - 40 U/L   ALT 20 9 - 46 U/L  Hemoglobin A1c  Result Value Ref Range   Hgb A1c MFr Bld 5.8 (H) <5.7 %  of total Hgb   Mean Plasma Glucose 120 (calc)   eAG (mmol/L) 6.6 (calc)  Lipid panel  Result Value Ref Range   Cholesterol 182 <200 mg/dL  HDL 50 > OR = 40 mg/dL   Triglycerides 47 <741 mg/dL   LDL Cholesterol (Calc) 118 (H) mg/dL (calc)   Total CHOL/HDL Ratio 3.6 <5.0 (calc)   Non-HDL Cholesterol (Calc) 132 (H) <130 mg/dL (calc)  Hepatitis B surface antibody,quantitative  Result Value Ref Range   Hepatitis B-Post <5 (L) > OR = 10 mIU/mL  Hepatitis B surface antigen  Result Value Ref Range   Hepatitis B Surface Ag NON-REACTIVE NON-REACTI  Hepatitis C antibody  Result Value Ref Range   Hepatitis C Ab NON-REACTIVE NON-REACTI   SIGNAL TO CUT-OFF 0.01 <1.00  HIV Antibody (routine testing w rflx)  Result Value Ref Range   HIV 1&2 Ab, 4th Generation NON-REACTIVE NON-REACTI  RPR  Result Value Ref Range   RPR Ser Ql NON-REACTIVE NON-REACTI

## 2020-09-12 NOTE — Patient Instructions (Addendum)
Great to see you again today!  It seems like you are doing great, keep up the good work I will be in touch with your labs asap You got your flu shot today  You also got your first dose of meningitis B vaccine- 2nd dose can be done in about one month as a nurse visit    Health Maintenance, Male Adopting a healthy lifestyle and getting preventive care are important in promoting health and wellness. Ask your health care provider about:  The right schedule for you to have regular tests and exams.  Things you can do on your own to prevent diseases and keep yourself healthy. What should I know about diet, weight, and exercise? Eat a healthy diet   Eat a diet that includes plenty of vegetables, fruits, low-fat dairy products, and lean protein.  Do not eat a lot of foods that are high in solid fats, added sugars, or sodium. Maintain a healthy weight Body mass index (BMI) is a measurement that can be used to identify possible weight problems. It estimates body fat based on height and weight. Your health care provider can help determine your BMI and help you achieve or maintain a healthy weight. Get regular exercise Get regular exercise. This is one of the most important things you can do for your health. Most adults should:  Exercise for at least 150 minutes each week. The exercise should increase your heart rate and make you sweat (moderate-intensity exercise).  Do strengthening exercises at least twice a week. This is in addition to the moderate-intensity exercise.  Spend less time sitting. Even light physical activity can be beneficial. Watch cholesterol and blood lipids Have your blood tested for lipids and cholesterol at 22 years of age, then have this test every 5 years. You may need to have your cholesterol levels checked more often if:  Your lipid or cholesterol levels are high.  You are older than 22 years of age.  You are at high risk for heart disease. What should I know  about cancer screening? Many types of cancers can be detected early and may often be prevented. Depending on your health history and family history, you may need to have cancer screening at various ages. This may include screening for:  Colorectal cancer.  Prostate cancer.  Skin cancer.  Lung cancer. What should I know about heart disease, diabetes, and high blood pressure? Blood pressure and heart disease  High blood pressure causes heart disease and increases the risk of stroke. This is more likely to develop in people who have high blood pressure readings, are of African descent, or are overweight.  Talk with your health care provider about your target blood pressure readings.  Have your blood pressure checked: ? Every 3-5 years if you are 51-34 years of age. ? Every year if you are 53 years old or older.  If you are between the ages of 13 and 19 and are a current or former smoker, ask your health care provider if you should have a one-time screening for abdominal aortic aneurysm (AAA). Diabetes Have regular diabetes screenings. This checks your fasting blood sugar level. Have the screening done:  Once every three years after age 14 if you are at a normal weight and have a low risk for diabetes.  More often and at a younger age if you are overweight or have a high risk for diabetes. What should I know about preventing infection? Hepatitis B If you have a higher risk for  hepatitis B, you should be screened for this virus. Talk with your health care provider to find out if you are at risk for hepatitis B infection. Hepatitis C Blood testing is recommended for:  Everyone born from [birth year] through 1963.  Anyone with known risk factors for hepatitis C. Sexually transmitted infections (STIs)  You should be screened each year for STIs, including gonorrhea and chlamydia, if: ? You are sexually active and are younger than 22 years of age. ? You are older than 22 years of age and your  health care provider tells you that you are at risk for this type of infection. ? Your sexual activity has changed since you were last screened, and you are at increased risk for chlamydia or gonorrhea. Ask your health care provider if you are at risk.  Ask your health care provider about whether you are at high risk for HIV. Your health care provider may recommend a prescription medicine to help prevent HIV infection. If you choose to take medicine to prevent HIV, you should first get tested for HIV. You should then be tested every 3 months for as long as you are taking the medicine. Follow these instructions at home: Lifestyle  Do not use any products that contain nicotine or tobacco, such as cigarettes, e-cigarettes, and chewing tobacco. If you need help quitting, ask your health care provider.  Do not use street drugs.  Do not share needles.  Ask your health care provider for help if you need support or information about quitting drugs. Alcohol use  Do not drink alcohol if your health care provider tells you not to drink.  If you drink alcohol: ? Limit how much you have to 0-2 drinks a day. ? Be aware of how much alcohol is in your drink. In the [place] one drink equals one 12 oz bottle of beer (355 mL), one 5 oz glass of wine (148 mL), or one 1 oz glass of hard liquor (44 mL). General instructions  Schedule regular health, dental, and eye exams.  Stay current with your vaccines.  Tell your health care provider if: ? You often feel depressed. ? You have ever been abused or do not feel safe at home. Summary  Adopting a healthy lifestyle and getting preventive care are important in promoting health and wellness.  Follow your health care provider's instructions about healthy diet, exercising, and getting tested or screened for diseases.  Follow your health care provider's instructions on monitoring your cholesterol and blood pressure. This information is not intended to replace  advice given to you by your health care provider. Make sure you discuss any questions you have with your health care provider. Document Revised: 11/26/2018 Document Reviewed: 11/26/2018 Elsevier Patient Education  2020 [employer].

## 2020-09-14 ENCOUNTER — Encounter: Payer: Self-pay | Admitting: Family Medicine

## 2020-09-14 ENCOUNTER — Ambulatory Visit (INDEPENDENT_AMBULATORY_CARE_PROVIDER_SITE_OTHER): Payer: Managed Care, Other (non HMO) | Admitting: Family Medicine

## 2020-09-14 ENCOUNTER — Other Ambulatory Visit: Payer: Self-pay

## 2020-09-14 VITALS — BP 118/82 | HR 73 | Resp 16 | Ht 67.5 in | Wt 177.0 lb

## 2020-09-14 DIAGNOSIS — Z13220 Encounter for screening for lipoid disorders: Secondary | ICD-10-CM

## 2020-09-14 DIAGNOSIS — Z0000 Encounter for general adult medical examination without abnormal findings: Secondary | ICD-10-CM | POA: Diagnosis not present

## 2020-09-14 DIAGNOSIS — Z130 Encounter for screening for diseases of the blood and blood-forming organs and certain disorders involving the immune mechanism: Secondary | ICD-10-CM

## 2020-09-14 DIAGNOSIS — Z23 Encounter for immunization: Secondary | ICD-10-CM

## 2020-09-14 DIAGNOSIS — Z131 Encounter for screening for diabetes mellitus: Secondary | ICD-10-CM

## 2020-09-14 DIAGNOSIS — Z113 Encounter for screening for infections with a predominantly sexual mode of transmission: Secondary | ICD-10-CM

## 2020-09-15 ENCOUNTER — Encounter: Payer: Self-pay | Admitting: Family Medicine

## 2020-09-21 LAB — LIPID PANEL
Cholesterol: 182 mg/dL (ref <200)
HDL: 50 mg/dL (ref >=40)
LDL Cholesterol (Calc): 118 mg/dL (calc) — ABNORMAL HIGH
Non-HDL Cholesterol (Calc): 132 mg/dL (calc) — ABNORMAL HIGH (ref <130)
Total CHOL/HDL Ratio: 3.6 (calc) (ref <5.0)
Triglycerides: 47 mg/dL (ref <150)

## 2020-09-21 LAB — CBC
HCT: 45 % (ref 38.5–50.0)
Hemoglobin: 13.7 g/dL (ref 13.2–17.1)
MCH: 23.5 pg — ABNORMAL LOW (ref 27.0–33.0)
MCHC: 30.4 g/dL — ABNORMAL LOW (ref 32.0–36.0)
MCV: 77.3 fL — ABNORMAL LOW (ref 80.0–100.0)
MPV: 10.8 fL (ref 7.5–12.5)
Platelets: 242 10*3/uL (ref 140–400)
RBC: 5.82 10*6/uL — ABNORMAL HIGH (ref 4.20–5.80)
RDW: 14.2 % (ref 11.0–15.0)
WBC: 4.1 10*3/uL (ref 3.8–10.8)

## 2020-09-21 LAB — TEST AUTHORIZATION

## 2020-09-21 LAB — COMPREHENSIVE METABOLIC PANEL
AG Ratio: 1.4 (calc) (ref 1.0–2.5)
ALT: 20 U/L (ref 9–46)
AST: 21 U/L (ref 10–40)
Albumin: 4.4 g/dL (ref 3.6–5.1)
Alkaline phosphatase (APISO): 63 U/L (ref 36–130)
BUN: 10 mg/dL (ref 7–25)
CO2: 30 mmol/L (ref 20–32)
Calcium: 9.8 mg/dL (ref 8.6–10.3)
Chloride: 105 mmol/L (ref 98–110)
Creat: 0.94 mg/dL (ref 0.60–1.35)
Globulin: 3.1 g/dL (calc) (ref 1.9–3.7)
Glucose, Bld: 87 mg/dL (ref 65–99)
Potassium: 4.3 mmol/L (ref 3.5–5.3)
Sodium: 139 mmol/L (ref 135–146)
Total Bilirubin: 0.5 mg/dL (ref 0.2–1.2)
Total Protein: 7.5 g/dL (ref 6.1–8.1)

## 2020-09-21 LAB — FERRITIN: Ferritin: 50 ng/mL (ref 38–380)

## 2020-09-21 LAB — HEPATITIS B SURFACE ANTIBODY, QUANTITATIVE: Hep B S AB Quant (Post): <5 m[IU]/mL — ABNORMAL LOW (ref >=10)

## 2020-09-21 LAB — HEMOGLOBIN A1C
Hgb A1c MFr Bld: 5.8 % of total Hgb — ABNORMAL HIGH (ref <5.7)
Mean Plasma Glucose: 120 (calc)
eAG (mmol/L): 6.6 (calc)

## 2020-09-21 LAB — RPR: RPR Ser Ql: NONREACTIVE

## 2020-09-21 LAB — HIV ANTIBODY (ROUTINE TESTING W REFLEX): HIV 1&2 Ab, 4th Generation: NONREACTIVE

## 2020-09-21 LAB — HEPATITIS C ANTIBODY
Hepatitis C Ab: NONREACTIVE
SIGNAL TO CUT-OFF: 0.01 (ref <1.00)

## 2020-09-21 LAB — HEPATITIS B SURFACE ANTIGEN: Hepatitis B Surface Ag: NONREACTIVE

## 2020-10-19 ENCOUNTER — Ambulatory Visit (INDEPENDENT_AMBULATORY_CARE_PROVIDER_SITE_OTHER): Payer: Managed Care, Other (non HMO)

## 2020-10-19 ENCOUNTER — Other Ambulatory Visit: Payer: Self-pay

## 2020-10-19 DIAGNOSIS — Z23 Encounter for immunization: Secondary | ICD-10-CM | POA: Diagnosis not present

## 2020-10-19 NOTE — Progress Notes (Signed)
Pt is here today for 2nd MenB shot. Pt was given MenB shot in right deltoid. Pt tolerated well.

## 2021-04-09 ENCOUNTER — Encounter: Payer: Self-pay | Admitting: Family Medicine

## 2021-04-10 MED ORDER — PROAIR HFA 108 (90 BASE) MCG/ACT IN AERS: 2.0000 | INHALATION_SPRAY | Freq: Four times a day (QID) | RESPIRATORY_TRACT | 5 refills | Status: DC | PRN

## 2021-08-30 ENCOUNTER — Encounter: Payer: Self-pay | Admitting: Family Medicine

## 2021-08-30 DIAGNOSIS — U071 COVID-19: Secondary | ICD-10-CM

## 2021-08-30 MED ORDER — MOLNUPIRAVIR EUA 200MG CAPSULE: 4.0000 | ORAL_CAPSULE | Freq: Two times a day (BID) | ORAL | 0 refills | Status: AC

## 2021-08-30 MED ORDER — ALBUTEROL SULFATE (2.5 MG/3ML) 0.083% IN NEBU: 2.5000 mg | INHALATION_SOLUTION | Freq: Four times a day (QID) | RESPIRATORY_TRACT | 0 refills | Status: DC | PRN

## 2021-09-14 NOTE — Progress Notes (Addendum)
[facility] [address] [place], [place] [address] [phone]  Date:  09/18/2021   Name:  Mark Juarez   DOB:  [birth year]   MRN:  [id number]  PCP:  Pearline Cables, MD    Chief Complaint: Annual Exam (Concerns/ questions: none/Flu shot today: yes/)   History of Present Illness:  Mark Juarez is a 25 y.o. 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  After graduation he hopes to continue with the [employer] in an administrative capacity  His seasonal allergies are under control  He is allergic to peanuts, tree nuts-needs a new EpiPen, also discussed use of Benadryl in case of reaction We did labs a year ago  He does a lot of physical activity at his job He is a non- smoker, not vaping He does not drink much alcohol  Flu shot today   Patient Active Problem List   Diagnosis Date Noted   Anaphylactic shock due to adverse food reaction 08/05/2019   Mild intermittent asthma without complication 08/05/2019   Seasonal and perennial allergic rhinoconjunctivitis 08/05/2019    Past Medical History:  Diagnosis Date   Allergy    Asthma    Eczema    Seasonal allergies    Urticaria     Past Surgical History:  Procedure Laterality Date   CIRCUMCISION     URETHRA SURGERY      Social History   Tobacco Use   Smoking status: Never   Smokeless tobacco: Never  Vaping Use   Vaping Use: Never used  Substance Use Topics   Alcohol use: No   Drug use: No    Family History  Problem Relation Age of Onset   Diabetes Mother    Hypertension Mother    Hyperlipidemia Mother    Heart attack Mother    Heart disease Mother    Allergic rhinitis Mother     Urticaria Mother    Eczema Mother    Diabetes Father    Hypertension Father    Hyperlipidemia Father    Allergic rhinitis Father    Urticaria Father    Allergic rhinitis Brother    Asthma Neg Hx     Allergies  Allergen Reactions   Peanuts [Peanut Oil] Anaphylaxis   Penicillins Swelling    Medication list has been reviewed and updated.  Current Outpatient Medications on File Prior to Visit  Medication Sig Dispense Refill   albuterol (PROVENTIL) (2.5 MG/3ML) 0.083% nebulizer solution Take 3 mLs (2.5 mg total) by nebulization every 6 (six) hours as needed for wheezing or shortness of breath. 150 mL 0   fluticasone (FLONASE) 50 MCG/ACT nasal spray Place into both nostrils daily.     loratadine (CLARITIN) 10 MG tablet Take 10 mg by mouth daily.     PROAIR HFA 108 (90 Base) MCG/ACT inhaler Inhale 2 puffs into the lungs every 6 (six) hours as needed for wheezing or shortness of breath. 18 g 5   No current facility-administered medications on file prior to visit.    Review of Systems:  As per HPI- otherwise negative.   Physical Examination: Vitals:  09/18/21 1044  BP: 110/80  Pulse: 74  Resp: 18  Temp: 98.7 F (37.1 C)  SpO2: 97%   Vitals:   09/18/21 1044  Weight: 198 lb 9.6 oz (90.1 kg)  Height: 5\' 8"  (1.727 m)   Body mass index is 30.2 kg/m. Ideal Body Weight: Weight in (lb) to have BMI = 25: 164.1  GEN: no acute distress.  Normal weight, looks well  HEENT: Atraumatic, Normocephalic. Bilateral TM wnl, oropharynx normal.  PEERL,EOMI.   Ears and Nose: No external deformity. CV: RRR, No M/G/R. No JVD. No thrill. No extra heart sounds. PULM: CTA B, no wheezes, crackles, rhonchi. No retractions. No resp. distress. No accessory muscle use. ABD: S, NT, ND, +BS. No rebound. No HSM. EXTR: No c/c/e PSYCH: Normally interactive. Conversant.  Sole of right foot displays a skin change as below:   Assessment and Plan: Physical exam  Screening, deficiency anemia, iron -  Plan: CBC  Screening for hyperlipidemia - Plan: Lipid panel  Screening for diabetes mellitus - Plan: Comprehensive metabolic panel, Hemoglobin A1c  Peanut allergy - Plan: EPINEPHrine (AUVI-Q) 0.3 mg/0.3 mL IJ SOAJ injection  Need for influenza vaccination - Plan: Flu Vaccine QUAD 6+ mos PF IM (Fluarix Quad PF)  Physical exam today -generally healthy young man with history of nut allergy.  Refill EpiPen. Flu shot given, labs pending as above Encouraged healthy diet and exercise routine Signed , MD  Received his labs as below, message to patient  Results for orders placed or performed in visit on 09/18/21  CBC  Result Value Ref Range   WBC 3.8 (L) 4.0 - 10.5 K/uL   RBC 5.33 4.22 - 5.81 Mil/uL   Platelets 226.0 150.0 - 400.0 K/uL   Hemoglobin 12.5 (L) 13.0 - 17.0 g/dL   HCT 11/18/21 37.3 - 42.8 %   MCV 76.5 (L) 78.0 - 100.0 fl   MCHC 30.7 30.0 - 36.0 g/dL   RDW 76.8 11.5 - 72.6 %  Comprehensive metabolic panel  Result Value Ref Range   Sodium 139 135 - 145 mEq/L   Potassium 3.7 3.5 - 5.1 mEq/L   Chloride 104 96 - 112 mEq/L   CO2 26 19 - 32 mEq/L   Glucose, Bld 87 70 - 99 mg/dL   BUN 12 6 - 23 mg/dL   Creatinine, Ser 20.3 0.40 - 1.50 mg/dL   Total Bilirubin 0.6 0.2 - 1.2 mg/dL   Alkaline Phosphatase 53 39 - 117 U/L   AST 16 0 - 37 U/L   ALT 16 0 - 53 U/L   Total Protein 7.5 6.0 - 8.3 g/dL   Albumin 4.3 3.5 - 5.2 g/dL   GFR 5.59 741.63 mL/min   Calcium 9.6 8.4 - 10.5 mg/dL  Lipid panel  Result Value Ref Range   Cholesterol 207 (H) 0 - 200 mg/dL   Triglycerides >84.53 0.0 - 149.0 mg/dL   HDL 64.6 80.32 mg/dL   VLDL >12.24 0.0 - 82.5 mg/dL   LDL Cholesterol 00.3 (H) 0 - 99 mg/dL   Total CHOL/HDL Ratio 5    NonHDL 162.92   Hemoglobin A1c  Result Value Ref Range   Hgb A1c MFr Bld 6.3 4.6 - 6.5 %

## 2021-09-18 ENCOUNTER — Ambulatory Visit (INDEPENDENT_AMBULATORY_CARE_PROVIDER_SITE_OTHER): Payer: 59 | Admitting: Family Medicine

## 2021-09-18 ENCOUNTER — Encounter: Payer: Self-pay | Admitting: Family Medicine

## 2021-09-18 ENCOUNTER — Other Ambulatory Visit: Payer: Self-pay

## 2021-09-18 VITALS — BP 110/80 | HR 74 | Temp 98.7°F | Resp 18 | Ht 68.0 in | Wt 198.6 lb

## 2021-09-18 DIAGNOSIS — Z23 Encounter for immunization: Secondary | ICD-10-CM | POA: Diagnosis not present

## 2021-09-18 DIAGNOSIS — Z130 Encounter for screening for diseases of the blood and blood-forming organs and certain disorders involving the immune mechanism: Secondary | ICD-10-CM

## 2021-09-18 DIAGNOSIS — Z0000 Encounter for general adult medical examination without abnormal findings: Secondary | ICD-10-CM | POA: Diagnosis not present

## 2021-09-18 DIAGNOSIS — Z13220 Encounter for screening for lipoid disorders: Secondary | ICD-10-CM | POA: Diagnosis not present

## 2021-09-18 DIAGNOSIS — Z91010 Allergy to peanuts: Secondary | ICD-10-CM

## 2021-09-18 DIAGNOSIS — Z131 Encounter for screening for diabetes mellitus: Secondary | ICD-10-CM | POA: Diagnosis not present

## 2021-09-18 LAB — LIPID PANEL
Cholesterol: 207 mg/dL — ABNORMAL HIGH (ref 0–200)
HDL: 43.6 mg/dL (ref >39.00)
LDL Cholesterol: 152 mg/dL — ABNORMAL HIGH (ref 0–99)
NonHDL: 162.92
Total CHOL/HDL Ratio: 5
Triglycerides: 55 mg/dL (ref 0.0–149.0)
VLDL: 11 mg/dL (ref 0.0–40.0)

## 2021-09-18 LAB — COMPREHENSIVE METABOLIC PANEL
ALT: 16 U/L (ref 0–53)
AST: 16 U/L (ref 0–37)
Albumin: 4.3 g/dL (ref 3.5–5.2)
Alkaline Phosphatase: 53 U/L (ref 39–117)
BUN: 12 mg/dL (ref 6–23)
CO2: 26 mEq/L (ref 19–32)
Calcium: 9.6 mg/dL (ref 8.4–10.5)
Chloride: 104 mEq/L (ref 96–112)
Creatinine, Ser: 0.94 mg/dL (ref 0.40–1.50)
GFR: 114.46 mL/min (ref >60.00)
Glucose, Bld: 87 mg/dL (ref 70–99)
Potassium: 3.7 mEq/L (ref 3.5–5.1)
Sodium: 139 mEq/L (ref 135–145)
Total Bilirubin: 0.6 mg/dL (ref 0.2–1.2)
Total Protein: 7.5 g/dL (ref 6.0–8.3)

## 2021-09-18 LAB — CBC
HCT: 40.8 % (ref 39.0–52.0)
Hemoglobin: 12.5 g/dL — ABNORMAL LOW (ref 13.0–17.0)
MCHC: 30.7 g/dL (ref 30.0–36.0)
MCV: 76.5 fl — ABNORMAL LOW (ref 78.0–100.0)
Platelets: 226 10*3/uL (ref 150.0–400.0)
RBC: 5.33 Mil/uL (ref 4.22–5.81)
RDW: 14.1 % (ref 11.5–15.5)
WBC: 3.8 10*3/uL — ABNORMAL LOW (ref 4.0–10.5)

## 2021-09-18 LAB — HEMOGLOBIN A1C: Hgb A1c MFr Bld: 6.3 % (ref 4.6–6.5)

## 2021-09-18 MED ORDER — EPINEPHRINE 0.3 MG/0.3ML IJ SOAJ: 0.3000 mg | Freq: Once | INTRAMUSCULAR | 2 refills | Status: AC

## 2021-09-18 NOTE — Patient Instructions (Signed)
It was great to see you again today!  I will be in touch with your labs Flu shot  given today Please get the new "bivalent" covid booster this fall Keep an epipen handy in case of allergic reaction- be sure you know how to use it!  Also smart to keep benadryl on hand- in case of a reaction chew 1-2 benadryl, it can also help!

## 2022-05-01 NOTE — Progress Notes (Addendum)
Therapist, music at [facility] ?[address], [address] ?[place], [place] [address] ?336 [phone] ?Fax [phone] ? ?Date:  05/02/2022  ? ?Name:  Mark Juarez   DOB:  [birth year]   MRN:  [id number] ? ?PCP:  Felipa Laroche, Gay Filler, MD  ? ? ?Chief Complaint: URI (Pt asks for a new neb machine- his is dying and another inhaler. /Concerns/ questions: Bilateral knee pain and swelling- taking Aleve) ? ? ?History of Present Illness: ? ?Mark Juarez is a 26 y.o. very pleasant male patient who presents with the following: ? 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 ?Pt location is his job, my location is home  ?Patient identity confirmed with 2 factors, he gives consent for virtual visit today.  The patient and myself are present on the call today ? ?Asthma is slightly worse recently ?He is still using his claritin and flonase. He is needing albuterol a bit more recently -he thinks due to springtime pollen ?He may use of albuterol every 1 to 2 weeks.  Would like a refill of both his inhaler and his nebulizer.  I advised him that if he starts needing albuterol several times a week please contact me and I can add an inhaled steroid ? ?He notes his knees may swell if he is on his feet a long time or does a long shift at home- he has noticed this for a year or so.  May be getting worse  ?He reports the knee joints will be visibly swollen ?Right knee is worse  ?Not popping or locking up ?Somewhat painful ?He might use some aleve- this does help some ?No other joint issues  ? ?He lives in [place]  ? ?Patient Active Problem List  ? Diagnosis Date Noted  ? Anaphylactic shock due to adverse food  reaction 08/05/2019  ? Mild intermittent asthma without complication [id number]  ? Seasonal and perennial allergic rhinoconjunctivitis 08/05/2019  ? ? ?Past Medical History:  ?Diagnosis Date  ? Allergy   ? Asthma   ? Eczema   ? Seasonal allergies   ? Urticaria   ? ? ?Past Surgical History:  ?Procedure Laterality Date  ? CIRCUMCISION    ? URETHRA SURGERY    ? ? ?Social History  ? ?Tobacco Use  ? Smoking status: Never  ? Smokeless tobacco: Never  ?Vaping Use  ? Vaping Use: Never used  ?Substance Use Topics  ? Alcohol use: No  ? Drug use: No  ? ? ?Family History  ?Problem Relation Age of Onset  ? Diabetes Mother   ? Hypertension Mother   ? Hyperlipidemia Mother   ? Heart attack Mother   ? Heart disease Mother   ? Allergic rhinitis Mother   ? Urticaria Mother   ? Eczema Mother   ? Diabetes Father   ? Hypertension Father   ? Hyperlipidemia Father   ? Allergic rhinitis Father   ? Urticaria Father   ? Allergic rhinitis Brother   ? Asthma Neg Hx   ? ? ?Allergies  ?Allergen Reactions  ? Peanuts [Peanut Oil] Anaphylaxis  ? Penicillins Swelling  ? ? ?Medication list has  been reviewed and updated. ? ?Current Outpatient Medications on File Prior to Visit  ?Medication Sig Dispense Refill  ? fluticasone (FLONASE) 50 MCG/ACT nasal spray Place into both nostrils daily.    ? loratadine (CLARITIN) 10 MG tablet Take 10 mg by mouth daily.    ? ?No current facility-administered medications on file prior to visit.  ? ? ?Review of Systems: ? ?As per HPI- otherwise negative. ? ? ?Physical Examination: ?There were no vitals filed for this visit. ?Vitals:  ? 05/02/22 1117  ?Weight: 192 lb (87.1 kg)  ?Height: 5' 9.5" (1.765 m)  ? ?Body mass index is 27.95 kg/m?. ?Ideal Body Weight: Weight in (lb) to have BMI = 25: 171.4 ?Patient observed her video monitor.  He looks well, no shortness of breath or wheezing is noted ? ? ?Assessment and Plan: ?Mild intermittent asthma, unspecified whether complicated - Plan: albuterol (PROVENTIL) (2.5 MG/3ML)  0.083% nebulizer solution, PROAIR HFA 108 (90 Base) MCG/ACT inhaler ? ?Knee swelling - Plan: Ambulatory referral to Orthopedic Surgery ? ?Virtual visit today, video used for visit.  Refilled albuterol, he continues to take his allergy medication.  If symptoms are worsening he will alert me ?Discussed his knee swelling.  I am certainly glad to see him, but if he is having true swelling it might make more sense to go straight to orthopedics.  He would like a referral, placed today ? ?Signed ?Lamar Blinks, MD ? ?Pt needs Xopenex instead of albuterol HFA- changed rx ?Pt would also like a neb machine- the pharmacy he uses does not provide this. Message sent to pt about options  ? ?

## 2022-05-02 ENCOUNTER — Encounter: Payer: Self-pay | Admitting: Family Medicine

## 2022-05-02 ENCOUNTER — Telehealth: Payer: Self-pay

## 2022-05-02 ENCOUNTER — Telehealth (INDEPENDENT_AMBULATORY_CARE_PROVIDER_SITE_OTHER): Payer: Managed Care, Other (non HMO) | Admitting: Family Medicine

## 2022-05-02 VITALS — Ht 69.5 in | Wt 192.0 lb

## 2022-05-02 DIAGNOSIS — J4520 Mild intermittent asthma, uncomplicated: Secondary | ICD-10-CM

## 2022-05-02 DIAGNOSIS — M25469 Effusion, unspecified knee: Secondary | ICD-10-CM

## 2022-05-02 MED ORDER — ALBUTEROL SULFATE (2.5 MG/3ML) 0.083% IN NEBU: 2.5000 mg | INHALATION_SOLUTION | Freq: Four times a day (QID) | RESPIRATORY_TRACT | 1 refills | Status: DC | PRN

## 2022-05-02 MED ORDER — PROAIR HFA 108 (90 BASE) MCG/ACT IN AERS: 2.0000 | INHALATION_SPRAY | Freq: Four times a day (QID) | RESPIRATORY_TRACT | 5 refills | Status: DC | PRN

## 2022-05-02 MED ORDER — LEVALBUTEROL TARTRATE 45 MCG/ACT IN AERO
1.0000 | INHALATION_SPRAY | Freq: Four times a day (QID) | RESPIRATORY_TRACT | 12 refills | Status: DC | PRN
Start: 2022-05-02 — End: 2022-09-26

## 2022-05-02 NOTE — Addendum Note (Signed)
Addended by: Abbe Amsterdam C on: 05/02/2022 04:00 PM ? ? Modules accepted: Orders ? ?

## 2022-05-02 NOTE — Telephone Encounter (Signed)
PA will be needed on Albuterol inhaler. Preferred agent is LEVALBUTEROL AER 45/ACT. Would you like to switch? Or proceed with PA? ?

## 2022-05-16 ENCOUNTER — Ambulatory Visit (HOSPITAL_BASED_OUTPATIENT_CLINIC_OR_DEPARTMENT_OTHER): Payer: Managed Care, Other (non HMO) | Admitting: Orthopaedic Surgery

## 2022-07-11 ENCOUNTER — Ambulatory Visit: Payer: 59 | Admitting: Orthopaedic Surgery

## 2022-09-25 DIAGNOSIS — R7303 Prediabetes: Secondary | ICD-10-CM | POA: Insufficient documentation

## 2022-09-25 NOTE — Patient Instructions (Incomplete)
It was great to see you again today, I will be in touch with your labs Recommend getting a COVID booster this fall!  Let me know if your asthma is worsening over the winter- we can add medication if need be for a while over the colder months  Take care!

## 2022-09-25 NOTE — Progress Notes (Unsigned)
[facility] [address], [place],  [address] [phone]  Date:  09/26/2022   Name:  Mark Juarez   DOB:  29-Jul-1996   MRN:  [id number]  PCP:  Darreld Mclean, MD    Chief Complaint: No chief complaint on file.   History of Present Illness:  Mark Juarez is a 26 y.o. 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, prediabetes  Most recent visit with myself about 1 year ago-we also had a video visit in May for asthma follow-up and referral to orthopedics for knee pain  Last year he was working at a [employer], was finishing up his degree at [employer] in business management  Flu shot COVID booster Labs done 1 year ago, update today Can offer STI screening   Patient Active Problem List   Diagnosis Date Noted   Anaphylactic shock due to adverse food reaction 08/05/2019   Mild intermittent asthma without complication 09/73/5329   Seasonal and perennial allergic rhinoconjunctivitis 08/05/2019    Past Medical History:  Diagnosis Date   Allergy    Asthma    Eczema    Seasonal allergies    Urticaria     Past Surgical History:  Procedure Laterality Date   CIRCUMCISION     URETHRA SURGERY      Social History   Tobacco Use   Smoking status: Never   Smokeless tobacco: Never  Vaping Use   Vaping Use: Never used  Substance Use Topics   Alcohol use: No   Drug use: No    Family History  Problem Relation Age of Onset   Diabetes Mother    Hypertension Mother    Hyperlipidemia Mother    Heart attack Mother    Heart disease Mother    Allergic rhinitis Mother    Urticaria Mother    Eczema Mother    Diabetes Father    Hypertension Father    Hyperlipidemia Father    Allergic rhinitis Father    Urticaria Father    Allergic rhinitis Brother    Asthma Neg Hx      Allergies  Allergen Reactions   Peanuts [Peanut Oil] Anaphylaxis   Penicillins Swelling    Medication list has been reviewed and updated.  Current Outpatient Medications on File Prior to Visit  Medication Sig Dispense Refill   albuterol (PROVENTIL) (2.5 MG/3ML) 0.083% nebulizer solution Take 3 mLs (2.5 mg total) by nebulization every 6 (six) hours as needed for wheezing or shortness of breath. 150 mL 1   fluticasone (FLONASE) 50 MCG/ACT nasal spray Place into both nostrils daily.     levalbuterol (XOPENEX HFA) 45 MCG/ACT inhaler Inhale 1-2 puffs into the lungs every 6 (six) hours as needed for wheezing. 1 each 12   loratadine (CLARITIN) 10 MG tablet Take 10 mg by mouth daily.     No current facility-administered medications on file prior to visit.    Review of Systems:  As per HPI- otherwise negative.   Physical Examination: There were no vitals filed for this visit. There were no vitals filed for this visit. There is no height or weight on file to calculate BMI. Ideal Body Weight:    GEN: no acute distress. HEENT: Atraumatic, Normocephalic.  Ears and Nose: No external deformity. CV: RRR, No M/G/R. No JVD. No thrill. No extra  heart sounds. PULM: CTA B, no wheezes, crackles, rhonchi. No retractions. No resp. distress. No accessory muscle use. ABD: S, NT, ND, +BS. No rebound. No HSM. EXTR: No c/c/e PSYCH: Normally interactive. Conversant.    Assessment and Plan: *** Physical exam today.  Encouraged healthy diet and exercise routine Signed Abbe Amsterdam, MD

## 2022-09-26 ENCOUNTER — Encounter: Payer: Self-pay | Admitting: Family Medicine

## 2022-09-26 ENCOUNTER — Other Ambulatory Visit: Payer: Self-pay | Admitting: Family Medicine

## 2022-09-26 ENCOUNTER — Ambulatory Visit (INDEPENDENT_AMBULATORY_CARE_PROVIDER_SITE_OTHER): Payer: 59 | Admitting: Family Medicine

## 2022-09-26 VITALS — BP 118/80 | HR 94 | Temp 97.9°F | Resp 18 | Ht 68.0 in | Wt 194.4 lb

## 2022-09-26 DIAGNOSIS — Z130 Encounter for screening for diseases of the blood and blood-forming organs and certain disorders involving the immune mechanism: Secondary | ICD-10-CM | POA: Diagnosis not present

## 2022-09-26 DIAGNOSIS — Z91010 Allergy to peanuts: Secondary | ICD-10-CM

## 2022-09-26 DIAGNOSIS — Z13220 Encounter for screening for lipoid disorders: Secondary | ICD-10-CM | POA: Diagnosis not present

## 2022-09-26 DIAGNOSIS — Z23 Encounter for immunization: Secondary | ICD-10-CM

## 2022-09-26 DIAGNOSIS — J4520 Mild intermittent asthma, uncomplicated: Secondary | ICD-10-CM

## 2022-09-26 DIAGNOSIS — Z131 Encounter for screening for diabetes mellitus: Secondary | ICD-10-CM

## 2022-09-26 DIAGNOSIS — R7303 Prediabetes: Secondary | ICD-10-CM

## 2022-09-26 DIAGNOSIS — Z0000 Encounter for general adult medical examination without abnormal findings: Secondary | ICD-10-CM | POA: Diagnosis not present

## 2022-09-26 DIAGNOSIS — D509 Iron deficiency anemia, unspecified: Secondary | ICD-10-CM

## 2022-09-26 LAB — COMPREHENSIVE METABOLIC PANEL
ALT: 17 U/L (ref 0–53)
AST: 17 U/L (ref 0–37)
Albumin: 4.3 g/dL (ref 3.5–5.2)
Alkaline Phosphatase: 63 U/L (ref 39–117)
BUN: 11 mg/dL (ref 6–23)
CO2: 28 mEq/L (ref 19–32)
Calcium: 9.8 mg/dL (ref 8.4–10.5)
Chloride: 104 mEq/L (ref 96–112)
Creatinine, Ser: 0.92 mg/dL (ref 0.40–1.50)
GFR: 116.62 mL/min (ref >60.00)
Glucose, Bld: 87 mg/dL (ref 70–99)
Potassium: 4 mEq/L (ref 3.5–5.1)
Sodium: 139 mEq/L (ref 135–145)
Total Bilirubin: 0.7 mg/dL (ref 0.2–1.2)
Total Protein: 7.9 g/dL (ref 6.0–8.3)

## 2022-09-26 LAB — CBC
HCT: 41.4 % (ref 39.0–52.0)
Hemoglobin: 12.8 g/dL — ABNORMAL LOW (ref 13.0–17.0)
MCHC: 31 g/dL (ref 30.0–36.0)
MCV: 77.2 fl — ABNORMAL LOW (ref 78.0–100.0)
Platelets: 218 10*3/uL (ref 150.0–400.0)
RBC: 5.36 Mil/uL (ref 4.22–5.81)
RDW: 15.1 % (ref 11.5–15.5)
WBC: 5.2 10*3/uL (ref 4.0–10.5)

## 2022-09-26 LAB — LIPID PANEL
Cholesterol: 189 mg/dL (ref 0–200)
HDL: 44.3 mg/dL (ref >39.00)
LDL Cholesterol: 136 mg/dL — ABNORMAL HIGH (ref 0–99)
NonHDL: 144.43
Total CHOL/HDL Ratio: 4
Triglycerides: 42 mg/dL (ref 0.0–149.0)
VLDL: 8.4 mg/dL (ref 0.0–40.0)

## 2022-09-26 LAB — HEMOGLOBIN A1C: Hgb A1c MFr Bld: 6.2 % (ref 4.6–6.5)

## 2022-09-26 MED ORDER — LEVALBUTEROL TARTRATE 45 MCG/ACT IN AERO: 1.0000 | INHALATION_SPRAY | Freq: Four times a day (QID) | RESPIRATORY_TRACT | 12 refills | Status: AC | PRN

## 2022-09-26 MED ORDER — ALBUTEROL SULFATE (2.5 MG/3ML) 0.083% IN NEBU: 2.5000 mg | INHALATION_SOLUTION | Freq: Four times a day (QID) | RESPIRATORY_TRACT | 3 refills | Status: AC | PRN

## 2022-09-27 MED ORDER — FLUTICASONE PROPIONATE 50 MCG/ACT NA SUSP: 2.0000 | Freq: Every day | NASAL | 5 refills | Status: DC

## 2022-11-18 ENCOUNTER — Telehealth: Payer: 59 | Admitting: Family

## 2022-11-18 DIAGNOSIS — N50812 Left testicular pain: Secondary | ICD-10-CM

## 2022-11-18 MED ORDER — DICLOFENAC SODIUM 75 MG PO TBEC: 75.0000 mg | DELAYED_RELEASE_TABLET | Freq: Two times a day (BID) | ORAL | 0 refills | Status: AC

## 2022-11-18 NOTE — Progress Notes (Signed)
Virtual Visit Consent   Mark Juarez, you are scheduled for a virtual visit with a [facility] provider today. Just as with appointments in the office, your consent must be obtained to participate. Your consent will be active for this visit and any virtual visit you may have with one of our providers in the next 365 days. If you have a MyChart account, a copy of this consent can be sent to you electronically.  As this is a virtual visit, video technology does not allow for your provider to perform a traditional examination. This may limit your provider's ability to fully assess your condition. If your provider identifies any concerns that need to be evaluated in person or the need to arrange testing (such as labs, EKG, etc.), we will make arrangements to do so. Although advances in technology are sophisticated, we cannot ensure that it will always work on either your end or our end. If the connection with a video visit is poor, the visit may have to be switched to a telephone visit. With either a video or telephone visit, we are not always able to ensure that we have a secure connection.  By engaging in this virtual visit, you consent to the provision of healthcare and authorize for your insurance to be billed (if applicable) for the services provided during this visit. Depending on your insurance coverage, you may receive a charge related to this service.  I need to obtain your verbal consent now. Are you willing to proceed with your visit today? Mark Juarez has provided verbal consent on 11/18/2022 for a virtual visit (video or telephone). Mark Rodney, FNP  Date: 11/18/2022 5:59 PM  Virtual Visit via Video Note   I, Mark Juarez, connected with  Mark Juarez  ([id number], 06/25/1996) on 11/18/22 at  6:00 PM EST by a video-enabled telemedicine application and verified that I am speaking with the correct person using two identifiers.  Location: Patient: Virtual Visit Location Patient:  Home Provider: Virtual Visit Location Provider: Home Office   I discussed the limitations of evaluation and management by telemedicine and the availability of in person appointments. The patient expressed understanding and agreed to proceed.    History of Present Illness: Mark Juarez is a 26 y.o. who identifies as a male who was assigned male at birth, and is being seen today for  mild pain of left testis. Reports the "cord" feels swollen. Denies any injury, fever, no discharge, dysuria, or pain with ejaculation . No unprotected sex or chace of STD.   HPI: HPI  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   Mild intermittent asthma without complication 08/05/2019   Seasonal and perennial allergic rhinoconjunctivitis 08/05/2019    Allergies:  Allergies  Allergen Reactions   Peanuts [Peanut Oil] Anaphylaxis   Penicillins Swelling   Medications:  Current Outpatient Medications:    diclofenac (VOLTAREN) 75 MG EC tablet, Take 1 tablet (75 mg total) by mouth 2 (two) times daily., Disp: 30 tablet, Rfl: 0   albuterol (PROVENTIL) (2.5 MG/3ML) 0.083% nebulizer solution, Take 3 mLs (2.5 mg total) by nebulization every 6 (six) hours as needed for wheezing or shortness of breath., Disp: 150 mL, Rfl: 3   fluticasone (FLONASE) 50 MCG/ACT nasal spray, Place 2 sprays into both nostrils daily., Disp: 16 g, Rfl: 5   levalbuterol (XOPENEX HFA) 45 MCG/ACT inhaler, Inhale 1-2 puffs into  the lungs every 6 (six) hours as needed for wheezing., Disp: 1 each, Rfl: 12   loratadine (CLARITIN) 10 MG tablet, Take 10 mg by mouth daily., Disp: , Rfl:   Observations/Objective: Patient is well-developed, well-nourished in no acute distress.  Resting comfortably  at home.  Head is normocephalic, atraumatic.  No labored breathing.  Speech is clear and coherent with logical content.  Patient is alert and oriented  at baseline.    Assessment and Plan: 1. Pain in left testicle - diclofenac (VOLTAREN) 75 MG EC tablet; Take 1 tablet (75 mg total) by mouth 2 (two) times daily.  Dispense: 30 tablet; Refill: 0  No swelling, erythema, or s/s of infection. States he has not had exposure to STD.  Will treat with antiinflammatory, if symptoms continue needs to be seen to have further testing to rule out STD.  Follow Up Instructions: I discussed the assessment and treatment plan with the patient. The patient was provided an opportunity to ask questions and all were answered. The patient agreed with the plan and demonstrated an understanding of the instructions.  A copy of instructions were sent to the patient via MyChart unless otherwise noted below.     The patient was advised to call back or seek an in-person evaluation if the symptoms worsen or if the condition fails to improve as anticipated.  Time:  I spent 11 minutes with the patient via telehealth technology discussing the above problems/concerns.    Evelina Dun, FNP

## 2022-11-18 NOTE — Patient Instructions (Signed)
Epididymitis  Epididymitis is inflammation or swelling of the epididymis. This is caused by an infection. The epididymis is a cord-like structure that is located along the top and back part of the testicle. It collects and stores sperm from the testicle. This condition can also cause pain and swelling of the testicle and scrotum. Symptoms usually start suddenly (acute epididymitis). Sometimes epididymitis starts gradually and lasts for a while (chronic epididymitis). Chronic epididymitis may be harder to treat. What are the causes? In men ages 20-40, this condition is usually caused by a bacterial infection or a sexually transmitted infection (STI), such as gonorrhea or chlamydia. In men 40 and older, this condition is usually caused by bacteria from a urinary blockage or from abnormalities in the urinary system. These can result from: Having a tube placed into the bladder (urinary catheter). Having an enlarged or inflamed prostate gland. Having recently had urinary tract surgery. Having a problem with a backward flow of urine (retrograde). In men who have a condition that weakens the body's defense system (immune system), such as human immunodeficiency virus (HIV), this condition can be caused by: Other bacteria, including tuberculosis and syphilis. Viruses. Fungi. Sometimes this condition occurs without infection. This may happen because of trauma or repetitive activities such as sports. What increases the risk? You are more likely to develop this condition if you have: Unprotected sex with more than one partner. Anal sex. Had recent surgery. A urinary catheter. Urinary problems. A suppressed immune system. What are the signs or symptoms? This condition usually begins suddenly with chills, fever, and pain behind the scrotum and in the testicle. Other symptoms include: Swelling of the scrotum, testicle, or both. Pain when ejaculating or urinating. Pain in the back or  abdomen. Nausea. Itching and discharge from the penis. A frequent need to pass urine. Redness, increased warmth, and tenderness of the scrotum. How is this diagnosed? Your health care provider can diagnose this condition based on your symptoms and medical history. Your health care provider will also do a physical exam to check your scrotum and testicle for swelling, pain, and redness. You may also have other tests, including: Testing of discharge from the penis. Testing your urine for infections, such as STIs. Ultrasound to check for blood flow and inflammation. Your health care provider may test you for other STIs, including HIV. How is this treated? Treatment for this condition depends on the cause. If your condition is caused by a bacterial infection, oral antibiotic medicine may be prescribed. If the bacterial infection has spread to your blood, you may need to receive IV antibiotics. For both bacterial and nonbacterial epididymitis, you may be treated with: Rest. Elevation of the scrotum. Pain medicines. Anti-inflammatory medicines. Surgery may be needed if: You have pus buildup in the scrotum (abscess). You have epididymitis that has not responded to other treatments. Follow these instructions at home: Medicines Take over-the-counter and prescription medicines only as told by your health care provider. If you were prescribed an antibiotic medicine, take it as told by your health care provider. Do not stop taking the antibiotic even if your condition improves. Sexual activity If your epididymitis was caused by an STI, avoid sexual activity until your treatment is complete. Inform your sexual partner or partners if you test positive for an STI. They may need to be treated. Do not engage in sexual activity with your partner or partners until their treatment is completed. Managing pain and swelling  If directed, raise (elevate) your scrotum and apply ice.   To do this: Put ice in a  plastic bag. Place a small towel or pillow between your legs. Rest your scrotum on the pillow or towel. Place another towel between your skin and the plastic bag. Leave the ice on for 20 minutes, 2-3 times a day. Remove the ice if your skin turns bright red. This is very important. If you cannot feel pain, heat, or cold, you have a greater risk of damage to the area. Keep your scrotum elevated and supported while resting. Ask your health care provider if you should wear a scrotal support, such as a jockstrap. Wear it as told by your health care provider. Try taking a sitz bath to help with discomfort. This is a warm water bath that is taken while you are sitting down. The water should come up to your hips and should cover your buttocks. Do this 3-4 times per day or as told by your health care provider. General instructions Drink enough fluid to keep your urine pale yellow. Return to your normal activities as told by your health care provider. Ask your health care provider what activities are safe for you. Keep all follow-up visits. This is important. Contact a health care provider if: You have a fever. Your pain medicine is not helping. Your pain is getting worse. Your symptoms do not improve within 3 days. Summary Epididymitis is inflammation or swelling of the epididymis. This is caused by an infection. This condition can also cause pain and swelling of the testicle and scrotum. Treatment for this condition depends on the cause. If your condition is caused by a bacterial infection, oral antibiotic medicine may be prescribed. Inform your sexual partner or partners if you test positive for an STI. They may need to be treated. Do not engage in sexual activity with your partner or partners until their treatment is completed. Contact a health care provider if your symptoms do not improve within 3 days. This information is not intended to replace advice given to you by your health care provider.  Make sure you discuss any questions you have with your health care provider. Document Revised: 07/12/2021 Document Reviewed: 07/12/2021 Elsevier Patient Education  2023 Elsevier Inc.  

## 2022-11-22 NOTE — Progress Notes (Addendum)
[facility] [address], [address] [place], [place] [address] [phone]  Date:  11/26/2022   Name:  Mark Juarez   DOB:  [birth year]   MRN:  [id number]  PCP:  Pearline Cables, MD    Chief Complaint: Testicle Pain (VV on 11/18/22 with Jannifer Rodney. He says the "lump" is about the same since that VV. )   History of Present Illness:  Mark Juarez is a 26 y.o. very pleasant male patient who presents with the following:  Patient seen today with concern of testicular pain/ lump I last saw him October for his physical, at that time he was doing fine History of prediabetes, peanut allergy, allergies and asthma  He did a virtual visit with Ms. Hawks, FNP on 12/3 "being seen today for mild pain of left testis. Reports the "cord" feels swollen. Denies any injury, fever, no discharge, dysuria, or pain with ejaculation . No unprotected sex or chace of STD."  He was prescribed Voltaren, he is seen today for continued symptoms He has noted this bump behind his left testicle for about 2 weeks Not getting worse  Not really tender He has felt well otherwise No dysuria No discharge  The voltaren did seem to help with the tenderness but the bump did not go away   Patient Active Problem List   Diagnosis Date Noted   Prediabetes 09/25/2022   Anaphylactic shock due to adverse food reaction 08/05/2019   Mild intermittent asthma without complication 08/05/2019   Seasonal and perennial allergic rhinoconjunctivitis 08/05/2019    Past Medical History:  Diagnosis Date   Allergy    Asthma    Eczema    Seasonal allergies    Urticaria     Past Surgical History:  Procedure Laterality Date   CIRCUMCISION     URETHRA SURGERY      Social History   Tobacco Use   Smoking status: Never   Smokeless tobacco: Never  Vaping Use   Vaping Use: Never used  Substance Use Topics   Alcohol use: No   Drug use: No    Family History  Problem  Relation Age of Onset   Diabetes Mother    Hypertension Mother    Hyperlipidemia Mother    Heart attack Mother    Heart disease Mother    Allergic rhinitis Mother    Urticaria Mother    Eczema Mother    Diabetes Father    Hypertension Father    Hyperlipidemia Father    Allergic rhinitis Father    Urticaria Father    Allergic rhinitis Brother    Asthma Neg Hx     Allergies  Allergen Reactions   Peanuts [Peanut Oil] Anaphylaxis   Penicillins Swelling    Medication list has been reviewed and updated.  Current Outpatient Medications on File Prior to Visit  Medication Sig Dispense Refill   albuterol (PROVENTIL) (2.5 MG/3ML) 0.083% nebulizer solution Take 3 mLs (2.5 mg total) by nebulization every 6 (six) hours as needed for wheezing or shortness of breath. 150 mL 3   diclofenac (VOLTAREN) 75 MG EC tablet Take 1 tablet (75 mg total) by mouth 2 (two) times daily. 30 tablet 0   fluticasone (FLONASE) 50 MCG/ACT nasal spray Place 2 sprays into both nostrils daily. 16 g 5   levalbuterol (XOPENEX HFA) 45 MCG/ACT inhaler Inhale 1-2 puffs into the lungs every 6 (six) hours as needed for wheezing. 1 each 12  loratadine (CLARITIN) 10 MG tablet Take 10 mg by mouth daily.     No current facility-administered medications on file prior to visit.    Review of Systems:  As per HPI- otherwise negative.   Physical Examination: Vitals:   11/26/22 1554  BP: 118/72  Pulse: 82  Resp: 18  Temp: 97.8 F (36.6 C)  SpO2: 97%   Vitals:   11/26/22 1554  Weight: 195 lb (88.5 kg)  Height: 5\' 8"  (1.727 m)   Body mass index is 29.65 kg/m. Ideal Body Weight: Weight in (lb) to have BMI = 25: 164.1  GEN: no acute distress. Normal weight, looks well  HEENT: Atraumatic, Normocephalic.  Ears and Nose: No external deformity. CV: RRR, No M/G/R. No JVD. No thrill. No extra heart sounds. PULM: CTA B, no wheezes, crackles, rhonchi. No retractions. No resp. distress. No accessory muscle use. ABD: S,  NT, ND. No rebound. No HSM. EXTR: No c/c/e PSYCH: Normally interactive. Conversant.  No inguinal hernia, normal circumcised penis, no discharge.  Right testicle is normal The left testicle displays a firm, rounded mass which takes up about 1/3 volume of the left scrotum  Pt's father is in the room for exam. Pt prefers his dad serve as chaperone today  Assessment and Plan: Pain in left testicle - Plan: US Scrotum, CANCELED: US Scrotum Pt seen today with a mass of the left testicle.  Ultrasound today - follow-up pending result   Signed Abbe Amsterdam, MD  Received ultrasound, called patient to D/w him.   Advises ultrasound is consistent with epididymitis-will need to get him in for STI testing.  Also, I am not sure about using Rocephin given his history of allergy to penicillin.  I will discuss this with the infection disease pharmacist and make a plan Patient reports he does not participate in anal intercourse  Called pt back 12/12- he notes he had a possible anaphylactic reaction to PCN as a child, "my throat swelled up"   He also notes STI is quite unlikely as he has not been SA in months He would prefer to treat for non- STI epididymitis for now- he will let me know if not improving Sent in rx for levaquin 500 daily for 10 days   US SCROTUM W/DOPPLER  Result Date: 11/26/2022 CLINICAL DATA:  Mass in the left testicle. EXAM: SCROTAL ULTRASOUND DOPPLER ULTRASOUND OF THE TESTICLES TECHNIQUE: Complete ultrasound examination of the testicles, epididymis, and other scrotal structures was performed. Color and spectral Doppler ultrasound were also utilized to evaluate blood flow to the testicles. COMPARISON:  None Available. FINDINGS: Right testicle Measurements: 4.8 x 2.2 x 3.0 cm. No mass or microlithiasis visualized. Left testicle Measurements: 4.2 x 2.2 x 3.2 cm. No mass or microlithiasis visualized. Right epididymis:  Normal in size and appearance. Left epididymis:  Enlarged, heterogeneous  and hypervascular. Hydrocele:  Small left hydrocele present. Varicocele:  None visualized. Pulsed Doppler interrogation of both testes demonstrates normal low resistance arterial and venous waveforms bilaterally. IMPRESSION: 1. Enlarged, heterogeneous and hypervascular left epididymis, consistent with epididymitis. 2. Small left hydrocele. 3. Normal sonographic appearance of the bilateral testicles. Electronically Signed   By: Darliss Cheney M.D.   On: 11/26/2022 18:30     For individuals at STI risk who do not practice insertive anal intercourse, we suggest treatment with ceftriaxone (500 mg intramuscular injection in one dose, or 1 g if patient weighs 150 kg or greater) plus doxycycline (100 mg orally twice a day for 10 days) for coverage  of N. gonorrhoeae and C. trachomatis [8]. For patients unable to tolerate doxycycline, a regimen of Ceftriaxone and a single azithromycin dose (1 g orally) is an alternative option. For patients unable to tolerate ceftriaxone due to cephalosporin allergy, a single 240 mg intramuscular dose of gentamicin plus a single 2 g oral dose of azithromycin is an option.   For patients at risk for STIs who do practice insertive anal intercourse, we suggest ceftriaxone (500 mg intramuscular injection in one dose, or 1 g if patient weighs 150 kg or greater) plus levofloxacin (500 mg orally once daily for 10 days)

## 2022-11-26 ENCOUNTER — Other Ambulatory Visit: Payer: Self-pay | Admitting: Family Medicine

## 2022-11-26 ENCOUNTER — Ambulatory Visit (HOSPITAL_BASED_OUTPATIENT_CLINIC_OR_DEPARTMENT_OTHER)
Admission: RE | Admit: 2022-11-26 | Discharge: 2022-11-26 | Disposition: A | Discharge status: Home / Self Care | Payer: 59 | Source: Ambulatory Visit | Attending: Family Medicine | Admitting: Family Medicine

## 2022-11-26 ENCOUNTER — Ambulatory Visit (INDEPENDENT_AMBULATORY_CARE_PROVIDER_SITE_OTHER): Payer: 59 | Admitting: Family Medicine

## 2022-11-26 VITALS — BP 118/72 | HR 82 | Temp 97.8°F | Resp 18 | Ht 68.0 in | Wt 195.0 lb

## 2022-11-26 DIAGNOSIS — N451 Epididymitis: Secondary | ICD-10-CM

## 2022-11-26 DIAGNOSIS — N50812 Left testicular pain: Secondary | ICD-10-CM

## 2022-11-26 NOTE — Patient Instructions (Signed)
Good to see you today- we will get an ultrasound of the bump on your scrotum and I will be in touch with your results asap

## 2022-11-27 MED ORDER — LEVOFLOXACIN 500 MG PO TABS: 500.0000 mg | ORAL_TABLET | Freq: Every day | ORAL | 0 refills | Status: DC

## 2022-11-27 NOTE — Addendum Note (Signed)
Addended by: Pearline Cables on: 11/27/2022 08:38 AM   Modules accepted: Orders

## 2022-11-29 ENCOUNTER — Encounter: Payer: Self-pay | Admitting: Family Medicine

## 2022-12-01 ENCOUNTER — Encounter: Payer: Self-pay | Admitting: Emergency Medicine

## 2022-12-01 ENCOUNTER — Ambulatory Visit
Admission: EM | Admit: 2022-12-01 | Discharge: 2022-12-01 | Disposition: A | Discharge status: Home / Self Care | Payer: 59 | Attending: Emergency Medicine | Admitting: Emergency Medicine

## 2022-12-01 DIAGNOSIS — M5431 Sciatica, right side: Secondary | ICD-10-CM

## 2022-12-01 MED ORDER — CYCLOBENZAPRINE HCL 5 MG PO TABS: 5.0000 mg | ORAL_TABLET | Freq: Every day | ORAL | 0 refills | Status: AC

## 2022-12-01 MED ORDER — METHYLPREDNISOLONE SODIUM SUCC 125 MG IJ SOLR
80.0000 mg | Freq: Once | INTRAMUSCULAR | Status: AC
  Administered 2022-12-01: 80 mg via INTRAMUSCULAR

## 2022-12-01 NOTE — ED Provider Notes (Signed)
[facility]   [id number] 12/01/22 Arrival Time: 1351   Chief Complaint  Patient presents with   Back Pain    lower pain constant - Entered by patient     SUBJECTIVE: History from: patient and family.  Mark Juarez is a 26 y.o. male who presents to the urgent care for complaint of right-sided lower back pain that started this morning.  Denies any precipitating event.  Has tried diclofenac, without without relief.  Denies previous symptoms in the past.  Symptoms are made worse with ROM.  Denies fever, chills, fatigue, sinus pain, rhinorrhea, blurry vision, changes in bowel or bladder habits.  .  ROS: As per HPI.  All other pertinent ROS negative.     Past Medical History:  Diagnosis Date   Allergy    Asthma    Eczema    Seasonal allergies    Urticaria    Past Surgical History:  Procedure Laterality Date   CIRCUMCISION     URETHRA SURGERY     Allergies  Allergen Reactions   Peanuts [Peanut Oil] Anaphylaxis   Penicillins Swelling   No current facility-administered medications on file prior to encounter.   Current Outpatient Medications on File Prior to Encounter  Medication Sig Dispense Refill   albuterol (PROVENTIL) (2.5 MG/3ML) 0.083% nebulizer solution Take 3 mLs (2.5 mg total) by nebulization every 6 (six) hours as needed for wheezing or shortness of breath. 150 mL 3   diclofenac (VOLTAREN) 75 MG EC tablet Take 1 tablet (75 mg total) by mouth 2 (two) times daily. 30 tablet 0   fluticasone (FLONASE) 50 MCG/ACT nasal spray Place 2 sprays into both nostrils daily. 16 g 5   levalbuterol (XOPENEX HFA) 45 MCG/ACT inhaler Inhale 1-2 puffs into the lungs every 6 (six) hours as needed for wheezing. 1 each 12   levofloxacin (LEVAQUIN) 500 MG tablet Take 1 tablet (500 mg total) by mouth daily. Take for 10 days 10 tablet 0   loratadine (CLARITIN) 10 MG tablet Take 10 mg by mouth daily.     Social History   Socioeconomic History   Marital status: Single    Spouse  name: Not on file   Number of children: Not on file   Years of education: Not on file   Highest education level: Not on file  Occupational History   Not on file  Tobacco Use   Smoking status: Never   Smokeless tobacco: Never  Vaping Use   Vaping Use: Never used  Substance and Sexual Activity   Alcohol use: No   Drug use: No   Sexual activity: Not on file  Other Topics Concern   Not on file  Social History Narrative   Not on file   Social Determinants of Health   Financial Resource Strain: Not on file  Food Insecurity: Not on file  Transportation Needs: Not on file  Physical Activity: Not on file  Stress: Not on file  Social Connections: Not on file  Intimate Partner Violence: Not on file   Family History  Problem Relation Age of Onset   Diabetes Mother    Hypertension Mother    Hyperlipidemia Mother    Heart attack Mother    Heart disease Mother    Allergic rhinitis Mother    Urticaria Mother    Eczema Mother    Diabetes Father    Hypertension Father    Hyperlipidemia Father    Allergic rhinitis Father    Urticaria Father    Allergic  rhinitis Brother    Asthma Neg Hx     OBJECTIVE:  Vitals:   12/01/22 1601 12/01/22 1602  BP: (!) 147/97   Pulse: 71   Resp: 18   Temp: 98.7 F (37.1 C)   TempSrc: Oral   SpO2: 98%   Weight:  190 lb (86.2 kg)  Height:  5\' 8"  (1.727 m)     Physical Exam Vitals and nursing note reviewed.  Constitutional:      General: He is not in acute distress.    Appearance: Normal appearance. He is normal weight. He is not ill-appearing, toxic-appearing or diaphoretic.  Cardiovascular:     Rate and Rhythm: Normal rate and regular rhythm.     Pulses: Normal pulses.     Heart sounds: Normal heart sounds. No murmur heard.    No friction rub. No gallop.  Pulmonary:     Effort: Pulmonary effort is normal. No respiratory distress.     Breath sounds: Normal breath sounds. No stridor. No wheezing, rhonchi or rales.  Chest:     Chest  wall: No tenderness.  Musculoskeletal:     Lumbar back: Positive right straight leg raise test.     Comments: Back:  Patient ambulates from chair to exam table without difficulty.  Inspection: Skin clear and intact without obvious swelling, erythema, or ecchymosis. Warm to the touch  Palpation: Vertebral processes nontender. Tenderness about the lower left paravertebral muscles  ROM: FROM Strength: 5/5 hip flexion, 5/5 knee extension, 5/5 knee flexion, 5/5 plantar flexion, 5/5 dorsiflexion  DTR: Patellar tendon reflex intact  Special Tests: Positive right straight leg raise    Neurological:     Mental Status: He is alert and oriented to person, place, and time.      LABS:  No results found for this or any previous visit (from the past 24 hour(s)).   ASSESSMENT & PLAN:  1. Right sided sciatica     Meds ordered this encounter  Medications   methylPREDNISolone sodium succinate (SOLU-MEDROL) 125 mg/2 mL injection 80 mg    IV methylprednisolone will be converted to either a q12h or q24h frequency with the same total daily dose (TDD).  Ordered Dose: 1 to 125 mg TDD; convert to: TDD q24h.  Ordered Dose: 126 to 250 mg TDD; convert to: TDD div q12h.  Ordered Dose: >250 mg TDD; DAW.   cyclobenzaprine (FLEXERIL) 5 MG tablet    Sig: Take 1 tablet (5 mg total) by mouth at bedtime.    Dispense:  30 tablet    Refill:  0    Discharge instructions  Rest, ice and heat as needed Ensure adequate ROM as tolerated. Solu-Medrol IM was given/take as directed Prescribed flexeril  for muscle spasm.  Do not drive or operate heavy machinery while taking this medication Return here or go to ER if you have any new or worsening symptoms such as numbness/tingling of the inner thighs, loss of bladder or bowel control, headache/blurry vision, nausea/vomiting, confusion/altered mental status, dizziness, weakness, passing out, imbalance, etc...    Reviewed expectations re: course of current medical  issues. Questions answered. Outlined signs and symptoms indicating need for more acute intervention. Patient verbalized understanding. After Visit Summary given.          , FNP 12/01/22 [phone]

## 2022-12-01 NOTE — Discharge Instructions (Addendum)
Rest, ice and heat as needed Ensure adequate ROM as tolerated. Solu-Medrol IM was given/take as directed Prescribed flexeril  for muscle spasm.  Do not drive or operate heavy machinery while taking this medication Return here or go to ER if you have any new or worsening symptoms such as numbness/tingling of the inner thighs, loss of bladder or bowel control, headache/blurry vision, nausea/vomiting, confusion/altered mental status, dizziness, weakness, passing out, imbalance, etc..Marland Kitchen

## 2022-12-01 NOTE — ED Triage Notes (Signed)
Patient c/o right sided low back pain since this morning.  No apparent injury.  Denies any urinary sx's.  Patient has taken Diclofenac 75mg  and Aleve.  Straining to have BM and the pain increased.

## 2023-02-19 ENCOUNTER — Telehealth: Payer: 59 | Admitting: Nurse Practitioner

## 2023-02-19 DIAGNOSIS — B9789 Other viral agents as the cause of diseases classified elsewhere: Secondary | ICD-10-CM | POA: Diagnosis not present

## 2023-02-19 DIAGNOSIS — J329 Chronic sinusitis, unspecified: Secondary | ICD-10-CM

## 2023-02-19 MED ORDER — FLUTICASONE PROPIONATE 50 MCG/ACT NA SUSP: 2.0000 | Freq: Every day | NASAL | 6 refills | Status: AC

## 2023-02-19 NOTE — Progress Notes (Signed)
E-Visit for Sinus Problems  We are sorry that you are not feeling well.  Here is how we plan to help!  Base don your length of symptoms you are likely still in the viral stages or an illness. Your sinus congestion may also be worsened by your underlying allergies.  We typically do not start antibiotics prior to 7-10 days of ongoing symptoms.   Providers prescribe antibiotics to treat infections caused by bacteria. Antibiotics are very powerful in treating bacterial infections when they are used properly. To maintain their effectiveness, they should be used only when necessary. Overuse of antibiotics has resulted in the development of superbugs that are resistant to treatment!    After careful review of your answers, I would not recommend an antibiotic for your condition.  Antibiotics are not effective against viruses and therefore should not be used to treat them. Common examples of infections caused by viruses include colds and flu  Based on what you have shared with me it looks like you have sinusitis.  Sinusitis is inflammation and infection in the sinus cavities of the head.  Based on your presentation I believe you most likely have Acute Viral Sinusitis.This is an infection most likely caused by a virus. There is not specific treatment for viral sinusitis other than to help you with the symptoms until the infection runs its course.  You may use an oral decongestant such as Mucinex D or if you have glaucoma or high blood pressure use plain Mucinex. Saline nasal spray help and can safely be used as often as needed for congestion, I have prescribed: Fluticasone nasal spray two sprays in each nostril once a day  Some authorities believe that zinc sprays or the use of Echinacea may shorten the course of your symptoms.  Sinus infections are not as easily transmitted as other respiratory infection, however we still recommend that you avoid close contact with loved ones, especially the very young and  elderly.  Remember to wash your hands thoroughly throughout the day as this is the number one way to prevent the spread of infection!  Home Care: Only take medications as instructed by your medical team. Do not take these medications with alcohol. A steam or ultrasonic humidifier can help congestion.  You can place a towel over your head and breathe in the steam from hot water coming from a faucet. Avoid close contacts especially the very young and the elderly. Cover your mouth when you cough or sneeze. Always remember to wash your hands.  Get Help Right Away If: You develop worsening fever or sinus pain. You develop a severe head ache or visual changes. Your symptoms persist after you have completed your treatment plan.  Make sure you Understand these instructions. Will watch your condition. Will get help right away if you are not doing well or get worse.   Thank you for choosing an e-visit.  Your e-visit answers were reviewed by a board certified advanced clinical practitioner to complete your personal care plan. Depending upon the condition, your plan could have included both over the counter or prescription medications.  Please review your pharmacy choice. Make sure the pharmacy is open so you can pick up prescription now. If there is a problem, you may contact your provider through [employer] and have the prescription routed to another pharmacy.  Your safety is important to [place]. If you have drug allergies check your prescription carefully.   For the next 24 hours you can use MyChart to ask questions  about today's visit, request a non-urgent call back, or ask for a work or school excuse. You will get an email in the next two days asking about your experience. I hope that your e-visit has been valuable and will speed your recovery.  Meds ordered this encounter  Medications   fluticasone (FLONASE) 50 MCG/ACT nasal spray    Sig: Place 2 sprays into both nostrils daily.     Dispense:  16 g    Refill:  6     I spent approximately 5 minutes reviewing the patient's history, current symptoms and coordinating their care today.

## 2023-03-27 ENCOUNTER — Encounter: Payer: Self-pay | Admitting: Family Medicine

## 2023-03-27 MED ORDER — ALBUTEROL SULFATE HFA 108 (90 BASE) MCG/ACT IN AERS: 1.0000 | INHALATION_SPRAY | Freq: Four times a day (QID) | RESPIRATORY_TRACT | 6 refills | Status: DC | PRN

## 2023-04-04 ENCOUNTER — Telehealth: Payer: 59 | Admitting: Family Medicine

## 2023-04-04 ENCOUNTER — Encounter: Payer: Self-pay | Admitting: Family Medicine

## 2023-04-04 DIAGNOSIS — J4530 Mild persistent asthma, uncomplicated: Secondary | ICD-10-CM | POA: Diagnosis not present

## 2023-04-04 MED ORDER — MONTELUKAST SODIUM 10 MG PO TABS: 10.0000 mg | ORAL_TABLET | Freq: Every day | ORAL | 0 refills | Status: DC

## 2023-04-04 NOTE — Patient Instructions (Addendum)
Mark Juarez, thank you for joining Mark Finner, NP for today's virtual visit.  While this provider is not your primary care provider (PCP), if your PCP is located in our provider database this encounter information will be shared with them immediately following your visit.   A [facility] MyChart account gives you access to today's visit and all your visits, tests, and labs performed at [facility] " click here if you don't have a [facility] MyChart account or go to [email or web address]  Consent: (Patient) Mark Juarez provided verbal consent for this virtual visit at the beginning of the encounter.  Current Medications:  Current Outpatient Medications:    montelukast (SINGULAIR) 10 MG tablet, Take 1 tablet (10 mg total) by mouth at bedtime., Disp: 30 tablet, Rfl: 0   albuterol (PROVENTIL) (2.5 MG/3ML) 0.083% nebulizer solution, Take 3 mLs (2.5 mg total) by nebulization every 6 (six) hours as needed for wheezing or shortness of breath., Disp: 150 mL, Rfl: 3   albuterol (VENTOLIN HFA) 108 (90 Base) MCG/ACT inhaler, Inhale 1-2 puffs into the lungs every 6 (six) hours as needed for wheezing or shortness of breath., Disp: 18 g, Rfl: 6   cyclobenzaprine (FLEXERIL) 5 MG tablet, Take 1 tablet (5 mg total) by mouth at bedtime., Disp: 30 tablet, Rfl: 0   diclofenac (VOLTAREN) 75 MG EC tablet, Take 1 tablet (75 mg total) by mouth 2 (two) times daily., Disp: 30 tablet, Rfl: 0   fluticasone (FLONASE) 50 MCG/ACT nasal spray, Place 2 sprays into both nostrils daily., Disp: 16 g, Rfl: 5   fluticasone (FLONASE) 50 MCG/ACT nasal spray, Place 2 sprays into both nostrils daily., Disp: 16 g, Rfl: 6   levalbuterol (XOPENEX HFA) 45 MCG/ACT inhaler, Inhale 1-2 puffs into the lungs every 6 (six) hours as needed for wheezing., Disp: 1 each, Rfl: 12   levofloxacin (LEVAQUIN) 500 MG tablet, Take 1 tablet (500 mg total) by mouth daily. Take for 10 days, Disp: 10 tablet, Rfl: 0   loratadine  (CLARITIN) 10 MG tablet, Take 10 mg by mouth daily., Disp: , Rfl:    Medications ordered in this encounter:  Meds ordered this encounter  Medications   montelukast (SINGULAIR) 10 MG tablet    Sig: Take 1 tablet (10 mg total) by mouth at bedtime.    Dispense:  30 tablet    Refill:  0    Order Specific Question:   Supervising Provider    Answer:   Merrilee Jansky [id number]     *If you need refills on other medications prior to your next appointment, please contact your pharmacy*  Follow-Up: Call back or seek an in-person evaluation if the symptoms worsen or if the condition fails to improve as anticipated.  [facility] [phone]  Other Instructions -possible allergy triggering asthma shortness of breath and cough -add singular in place of claritin,  follow up with PCP for on going Rx of this if it helps -use inhaler every 4-6 hours as needed -flonase as well -if not improved follow up in person for possible need of a LABA   If you have been instructed to have an in-person evaluation today at a local Urgent Care facility, please use the link below. It will take you to a list of all of our available [facility], including address, phone number and hours of operation. Please do not delay care.   Urgent Cares  If you or a family member do not have  a primary care provider, use the link below to schedule a visit and establish care. When you choose a [facility] primary care physician or advanced practice provider, you gain a long-term partner in health. Find a Primary Care Provider  Learn more about [facility]'s in-office and virtual care options: [facility]

## 2023-04-04 NOTE — Progress Notes (Signed)
Virtual Visit Consent   Mark Juarez, you are scheduled for a virtual visit with a [facility] provider today. Just as with appointments in the office, your consent must be obtained to participate. Your consent will be active for this visit and any virtual visit you may have with one of our providers in the next 365 days. If you have a MyChart account, a copy of this consent can be sent to you electronically.  As this is a virtual visit, video technology does not allow for your provider to perform a traditional examination. This may limit your provider's ability to fully assess your condition. If your provider identifies any concerns that need to be evaluated in person or the need to arrange testing (such as labs, EKG, etc.), we will make arrangements to do so. Although advances in technology are sophisticated, we cannot ensure that it will always work on either your end or our end. If the connection with a video visit is poor, the visit may have to be switched to a telephone visit. With either a video or telephone visit, we are not always able to ensure that we have a secure connection.  By engaging in this virtual visit, you consent to the provision of healthcare and authorize for your insurance to be billed (if applicable) for the services provided during this visit. Depending on your insurance coverage, you may receive a charge related to this service.  I need to obtain your verbal consent now. Are you willing to proceed with your visit today? Mark Juarez has provided verbal consent on 04/04/2023 for a virtual visit (video or telephone). Freddy Finner, NP  Date: 04/04/2023 1:58 PM  Virtual Visit via Video Note   I, Freddy Finner, connected with  Mark Juarez  ([id number], [birth year]) on 04/04/23 at  2:00 PM EDT by a video-enabled telemedicine application and verified that I am speaking with the correct person using two identifiers.  Location: Patient: Virtual Visit Location Patient:  Home Provider: Virtual Visit Location Provider: Home Office   I discussed the limitations of evaluation and management by telemedicine and the availability of in person appointments. The patient expressed understanding and agreed to proceed.    History of Present Illness: Mark Juarez is a 27 y.o. who identifies as a male who was assigned male at birth, and is being seen today for asthma Received an inhaler refill from PCP on 03/27/2023  Onset was asthma flare 7 days ago, shortness of breath when moving around, cough- tickle in throat- but mild.  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   Mild intermittent asthma without complication 08/05/2019   Seasonal and perennial allergic rhinoconjunctivitis 08/05/2019   Asthma 07/20/2019    Allergies:  Allergies  Allergen Reactions   Peanuts [Peanut Oil] Anaphylaxis   Penicillins Swelling   Medications:  Current Outpatient Medications:    albuterol (PROVENTIL) (2.5 MG/3ML) 0.083% nebulizer solution, Take 3 mLs (2.5 mg total) by nebulization every 6 (six) hours as needed for wheezing or shortness of breath., Disp: 150 mL, Rfl: 3   albuterol (VENTOLIN HFA) 108 (90 Base) MCG/ACT inhaler, Inhale 1-2 puffs into the lungs every 6 (six) hours as needed for wheezing or shortness of breath., Disp:  18 g, Rfl: 6   cyclobenzaprine (FLEXERIL) 5 MG tablet, Take 1 tablet (5 mg total) by mouth at bedtime., Disp: 30 tablet, Rfl: 0   diclofenac (VOLTAREN) 75 MG EC tablet, Take 1 tablet (75 mg total) by mouth 2 (two) times daily., Disp: 30 tablet, Rfl: 0   fluticasone (FLONASE) 50 MCG/ACT nasal spray, Place 2 sprays into both nostrils daily., Disp: 16 g, Rfl: 5   fluticasone (FLONASE) 50  MCG/ACT nasal spray, Place 2 sprays into both nostrils daily., Disp: 16 g, Rfl: 6   levalbuterol (XOPENEX HFA) 45 MCG/ACT inhaler, Inhale 1-2 puffs into the lungs every 6 (six) hours as needed for wheezing., Disp: 1 each, Rfl: 12   levofloxacin (LEVAQUIN) 500 MG tablet, Take 1 tablet (500 mg total) by mouth daily. Take for 10 days, Disp: 10 tablet, Rfl: 0   loratadine (CLARITIN) 10 MG tablet, Take 10 mg by mouth daily., Disp: , Rfl:   Observations/Objective: Patient is well-developed, well-nourished in no acute distress.  Resting comfortably  at home.  Head is normocephalic, atraumatic.  No labored breathing.  Speech is clear and coherent with logical content.  Patient is alert and oriented at baseline.    Assessment and Plan: 1. Mild persistent asthma without complication  - montelukast (SINGULAIR) 10 MG tablet; Take 1 tablet (10 mg total) by mouth at bedtime.  Dispense: 30 tablet; Refill: 0  -possible allergy triggering asthma shortness of breath and cough -add singular in place of claritin, follow up with PCP for on going Rx of this if it helps -use inhaler every 4-6 hours as needed -flonase as well -if not improved follow up in person for possible need of a LABA  Reviewed side effects, risks and benefits of medication.    Patient acknowledged agreement and understanding of the plan.   Past Medical, Surgical, Social History, Allergies, and Medications have been Reviewed.    Follow Up Instructions: I discussed the assessment and treatment plan with the patient. The patient was provided an opportunity to ask questions and all were answered. The patient agreed with the plan and demonstrated an understanding of the instructions.  A copy of instructions were sent to the patient via MyChart unless otherwise noted below.    The patient was advised to call back or seek an in-person evaluation if the symptoms worsen or if the condition fails to improve as anticipated.  Time:  I  spent 10 minutes with the patient via telehealth technology discussing the above problems/concerns.    Freddy Finner, NP

## 2023-04-05 NOTE — Telephone Encounter (Signed)
Okay to write Rx for nebulizer machine? If okay I can print and fax.

## 2023-04-05 NOTE — Telephone Encounter (Signed)
Order has been faxed

## 2023-10-09 ENCOUNTER — Encounter: Payer: 59 | Admitting: Family Medicine

## 2023-10-12 NOTE — Progress Notes (Unsigned)
[facility] [address] [place], [place] [address] [phone]  Date:  10/16/2023   Name:  RIKUTO VONBERGEN   DOB:  [birth year]   MRN:  [id number]  PCP:  Pearline Cables, MD    Chief Complaint: No chief complaint on file.   History of Present Illness:  SCHAUN DECARLI is a 27 y.o. 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   Mild intermittent asthma without complication 08/05/2019   Seasonal and perennial allergic rhinoconjunctivitis 08/05/2019   Asthma 07/20/2019    Past Medical History:  Diagnosis Date   Allergy    Asthma    Eczema    Seasonal allergies    Urticaria     Past Surgical History:  Procedure Laterality Date   CIRCUMCISION     URETHRA SURGERY      Social History   Tobacco Use   Smoking status: Never   Smokeless tobacco: Never  Vaping Use   Vaping status: Never Used  Substance Use Topics   Alcohol use: No   Drug use: No    Family History  Problem Relation Age of Onset   Diabetes Mother    Hypertension Mother    Hyperlipidemia Mother    Heart attack Mother    Heart disease Mother    Allergic rhinitis Mother    Urticaria Mother    Eczema Mother    Diabetes Father    Hypertension Father    Hyperlipidemia Father    Allergic rhinitis Father    Urticaria Father    Allergic rhinitis Brother    Asthma Neg Hx     Allergies  Allergen Reactions   Peanuts [Peanut Oil] Anaphylaxis   Penicillins Swelling    Medication list has been reviewed and updated.  Current Outpatient Medications on File Prior to Visit  Medication Sig Dispense Refill    albuterol (PROVENTIL) (2.5 MG/3ML) 0.083% nebulizer solution Take 3 mLs (2.5 mg total) by nebulization every 6 (six) hours as needed for wheezing or shortness of breath. 150 mL 3   albuterol (VENTOLIN HFA) 108 (90 Base) MCG/ACT inhaler Inhale 1-2 puffs into the lungs every 6 (six) hours as needed for wheezing or shortness of breath. 18 g 6   cyclobenzaprine (FLEXERIL) 5 MG tablet Take 1 tablet (5 mg total) by mouth at bedtime. 30 tablet 0   diclofenac (VOLTAREN) 75 MG EC tablet Take 1 tablet (75 mg total) by mouth 2 (two) times daily. 30 tablet 0   fluticasone (FLONASE) 50 MCG/ACT nasal spray Place 2 sprays into both nostrils daily. 16 g 5   fluticasone (FLONASE) 50 MCG/ACT nasal spray Place 2 sprays into both nostrils daily. 16 g 6   levalbuterol (XOPENEX HFA) 45 MCG/ACT inhaler Inhale 1-2 puffs into the lungs every 6 (six) hours as needed for wheezing. 1 each 12   levofloxacin (LEVAQUIN) 500 MG tablet Take 1 tablet (500 mg total) by mouth daily. Take for 10 days 10 tablet 0   loratadine (CLARITIN) 10 MG tablet Take 10 mg by mouth daily.     montelukast (SINGULAIR) 10  MG tablet Take 1 tablet (10 mg total) by mouth at bedtime. 30 tablet 0   No current facility-administered medications on file prior to visit.    Review of Systems:  As per HPI- otherwise negative.   Physical Examination: There were no vitals filed for this visit. There were no vitals filed for this visit. There is no height or weight on file to calculate BMI. Ideal Body Weight:    GEN: no acute distress. HEENT: Atraumatic, Normocephalic.  Ears and Nose: No external deformity. CV: RRR, No M/G/R. No JVD. No thrill. No extra heart sounds. PULM: CTA B, no wheezes, crackles, rhonchi. No retractions. No resp. distress. No accessory muscle use. ABD: S, NT, ND, +BS. No rebound. No HSM. EXTR: No c/c/e PSYCH: Normally interactive. Conversant.    Assessment and Plan: *** Physical exam today- encouraged healthy diet and  exercise routine Will plan further follow- up pending labs.  Signed Abbe Amsterdam, MD

## 2023-10-16 ENCOUNTER — Telehealth: Payer: Self-pay

## 2023-10-16 ENCOUNTER — Encounter: Payer: Self-pay | Admitting: Family Medicine

## 2023-10-16 ENCOUNTER — Other Ambulatory Visit (HOSPITAL_COMMUNITY): Payer: Self-pay

## 2023-10-16 ENCOUNTER — Ambulatory Visit (INDEPENDENT_AMBULATORY_CARE_PROVIDER_SITE_OTHER): Payer: Managed Care, Other (non HMO) | Admitting: Family Medicine

## 2023-10-16 VITALS — BP 114/80 | HR 88 | Temp 98.1°F | Ht 68.0 in | Wt 193.2 lb

## 2023-10-16 DIAGNOSIS — Z0000 Encounter for general adult medical examination without abnormal findings: Secondary | ICD-10-CM

## 2023-10-16 DIAGNOSIS — R7303 Prediabetes: Secondary | ICD-10-CM

## 2023-10-16 DIAGNOSIS — Z113 Encounter for screening for infections with a predominantly sexual mode of transmission: Secondary | ICD-10-CM

## 2023-10-16 DIAGNOSIS — J4530 Mild persistent asthma, uncomplicated: Secondary | ICD-10-CM

## 2023-10-16 DIAGNOSIS — Z91010 Allergy to peanuts: Secondary | ICD-10-CM

## 2023-10-16 DIAGNOSIS — Z23 Encounter for immunization: Secondary | ICD-10-CM

## 2023-10-16 DIAGNOSIS — Z130 Encounter for screening for diseases of the blood and blood-forming organs and certain disorders involving the immune mechanism: Secondary | ICD-10-CM

## 2023-10-16 DIAGNOSIS — Z13220 Encounter for screening for lipoid disorders: Secondary | ICD-10-CM

## 2023-10-16 LAB — COMPREHENSIVE METABOLIC PANEL
ALT: 17 U/L (ref 0–53)
AST: 17 U/L (ref 0–37)
Albumin: 4.4 g/dL (ref 3.5–5.2)
Alkaline Phosphatase: 56 U/L (ref 39–117)
BUN: 13 mg/dL (ref 6–23)
CO2: 30 meq/L (ref 19–32)
Calcium: 9.8 mg/dL (ref 8.4–10.5)
Chloride: 105 meq/L (ref 96–112)
Creatinine, Ser: 1.03 mg/dL (ref 0.40–1.50)
GFR: 101.09 mL/min (ref >60.00)
Glucose, Bld: 87 mg/dL (ref 70–99)
Potassium: 4.1 meq/L (ref 3.5–5.1)
Sodium: 141 meq/L (ref 135–145)
Total Bilirubin: 0.6 mg/dL (ref 0.2–1.2)
Total Protein: 8.2 g/dL (ref 6.0–8.3)

## 2023-10-16 LAB — LIPID PANEL
Cholesterol: 208 mg/dL — ABNORMAL HIGH (ref 0–200)
HDL: 47.8 mg/dL (ref >39.00)
LDL Cholesterol: 150 mg/dL — ABNORMAL HIGH (ref 0–99)
NonHDL: 159.77
Total CHOL/HDL Ratio: 4
Triglycerides: 47 mg/dL (ref 0.0–149.0)
VLDL: 9.4 mg/dL (ref 0.0–40.0)

## 2023-10-16 LAB — CBC
HCT: 43.3 % (ref 39.0–52.0)
Hemoglobin: 13.2 g/dL (ref 13.0–17.0)
MCHC: 30.4 g/dL (ref 30.0–36.0)
MCV: 77 fL — ABNORMAL LOW (ref 78.0–100.0)
Platelets: 168 10*3/uL (ref 150.0–400.0)
RBC: 5.61 Mil/uL (ref 4.22–5.81)
RDW: 15.4 % (ref 11.5–15.5)
WBC: 5.4 10*3/uL (ref 4.0–10.5)

## 2023-10-16 LAB — HEMOGLOBIN A1C: Hgb A1c MFr Bld: 6 % (ref 4.6–6.5)

## 2023-10-16 MED ORDER — EPINEPHRINE 0.3 MG/0.3ML IJ SOAJ: 0.3000 mg | INTRAMUSCULAR | 99 refills | Status: AC | PRN

## 2023-10-16 MED ORDER — ALBUTEROL SULFATE HFA 108 (90 BASE) MCG/ACT IN AERS: 1.0000 | INHALATION_SPRAY | Freq: Four times a day (QID) | RESPIRATORY_TRACT | 6 refills | Status: DC | PRN

## 2023-10-16 NOTE — Addendum Note (Signed)
Addended by: Judieth Keens on: 10/16/2023 09:53 AM   Modules accepted: Orders

## 2023-10-16 NOTE — Patient Instructions (Signed)
It was good to see you again today- I will be in touch with your labs asap Keep taking good care of yourself!   Let me know if your asthma symptoms are getting worse- we can add an inhaled steroid over the winter if needed

## 2023-10-16 NOTE — Telephone Encounter (Signed)
Pharmacy Patient Advocate Encounter   Received notification from CoverMyMeds that prior authorization for Albuterol Sulfate HFA 108 (90 Base)MCG/ACT aerosol is required/requested.   Insurance verification completed.   The patient is insured through [facility] .   Per test claim: CANCELLED due to Insurance will pay for the 8.5g inhaler.

## 2023-10-17 ENCOUNTER — Encounter: Payer: Self-pay | Admitting: Family Medicine

## 2023-10-17 LAB — RPR: RPR Ser Ql: NONREACTIVE

## 2023-10-17 LAB — HIV ANTIBODY (ROUTINE TESTING W REFLEX): HIV 1&2 Ab, 4th Generation: NONREACTIVE

## 2023-10-17 LAB — HEPATITIS C ANTIBODY: Hepatitis C Ab: NONREACTIVE

## 2023-10-17 LAB — HEPATITIS B SURFACE ANTIGEN: Hepatitis B Surface Ag: NONREACTIVE

## 2023-10-17 MED ORDER — ALBUTEROL SULFATE HFA 108 (90 BASE) MCG/ACT IN AERS: 1.0000 | INHALATION_SPRAY | Freq: Four times a day (QID) | RESPIRATORY_TRACT | 5 refills | Status: AC | PRN

## 2023-10-17 NOTE — Telephone Encounter (Signed)
Rx adjusted

## 2024-02-12 ENCOUNTER — Telehealth: Payer: Self-pay | Admitting: Physician Assistant

## 2024-02-12 DIAGNOSIS — J019 Acute sinusitis, unspecified: Secondary | ICD-10-CM

## 2024-02-12 DIAGNOSIS — B9689 Other specified bacterial agents as the cause of diseases classified elsewhere: Secondary | ICD-10-CM

## 2024-02-12 MED ORDER — DOXYCYCLINE HYCLATE 100 MG PO TABS: 100.0000 mg | ORAL_TABLET | Freq: Two times a day (BID) | ORAL | 0 refills | Status: AC

## 2024-02-12 NOTE — Progress Notes (Signed)

## 2024-02-21 ENCOUNTER — Telehealth: Payer: Self-pay | Admitting: Physician Assistant

## 2024-02-21 DIAGNOSIS — R310 Gross hematuria: Secondary | ICD-10-CM

## 2024-02-21 NOTE — Patient Instructions (Signed)
 Mark Juarez, thank you for joining Margaretann Loveless, PA-C for today's virtual visit.  While this provider is not your primary care provider (PCP), if your PCP is located in our provider database this encounter information will be shared with them immediately following your visit.   A [facility] MyChart account gives you access to today's visit and all your visits, tests, and labs performed at [facility] " click here if you don't have a [facility] MyChart account or go to [email or web address]  Consent: (Patient) Mark Juarez provided verbal consent for this virtual visit at the beginning of the encounter.  Current Medications:  Current Outpatient Medications:    albuterol (PROVENTIL) (2.5 MG/3ML) 0.083% nebulizer solution, Take 3 mLs (2.5 mg total) by nebulization every 6 (six) hours as needed for wheezing or shortness of breath., Disp: 150 mL, Rfl: 3   albuterol (VENTOLIN HFA) 108 (90 Base) MCG/ACT inhaler, Inhale 1-2 puffs into the lungs every 6 (six) hours as needed for wheezing or shortness of breath., Disp: 8.5 g, Rfl: 5   cyclobenzaprine (FLEXERIL) 5 MG tablet, Take 1 tablet (5 mg total) by mouth at bedtime., Disp: 30 tablet, Rfl: 0   diclofenac (VOLTAREN) 75 MG EC tablet, Take 1 tablet (75 mg total) by mouth 2 (two) times daily., Disp: 30 tablet, Rfl: 0   doxycycline (VIBRA-TABS) 100 MG tablet, Take 1 tablet (100 mg total) by mouth 2 (two) times daily., Disp: 20 tablet, Rfl: 0   EPINEPHrine (EPIPEN 2-PAK) 0.3 mg/0.3 mL IJ SOAJ injection, Inject 0.3 mg into the muscle as needed for anaphylaxis., Disp: 1 each, Rfl: PRN   fluticasone (FLONASE) 50 MCG/ACT nasal spray, Place 2 sprays into both nostrils daily., Disp: 16 g, Rfl: 6   levalbuterol (XOPENEX HFA) 45 MCG/ACT inhaler, Inhale 1-2 puffs into the lungs every 6 (six) hours as needed for wheezing., Disp: 1 each, Rfl: 12   loratadine (CLARITIN) 10 MG tablet, Take 10 mg by mouth daily., Disp: , Rfl:    Medications  ordered in this encounter:  No orders of the defined types were placed in this encounter.    *If you need refills on other medications prior to your next appointment, please contact your pharmacy*  Follow-Up: Call back or seek an in-person evaluation if the symptoms worsen or if the condition fails to improve as anticipated.  [facility] [phone]  Other Instructions  Kidney Stones  Kidney stones are solid, rock-like deposits that form inside of the kidneys. The kidneys are a pair of organs that make urine. A kidney stone may form in a kidney and move into other parts of the urinary tract, including the tubes that connect the kidneys to the bladder (ureters), the bladder, and the tube that carries urine out of the body (urethra). As the stone moves through these areas, it can cause intense pain and block the flow of urine. Kidney stones are created when high levels of certain minerals are found in the urine. The stones are usually passed out of the body through urination, but in some cases, medical treatment may be needed to remove them. What are the causes? Kidney stones may be caused by: A condition in which certain glands produce too much parathyroid hormone (primary hyperparathyroidism), which causes too much calcium buildup in the blood. A buildup of uric acid crystals in the bladder (hyperuricosuria). Uric acid is a chemical that the body produces when you eat certain foods. It usually leaves the body in the urine.  Narrowing (stricture) of one or both of the ureters. A kidney blockage that is present at birth (congenital obstruction). Past surgery on the kidney or the ureters. What increases the risk? The following factors may make you more likely to develop this condition: Having had a kidney stone in the past. Having a family history of kidney stones. Not drinking enough water. Eating a diet that is high in protein, salt (sodium), or sugar. Being overweight or  obese. What are the signs or symptoms? Symptoms of a kidney stone may include: Pain in the side of the abdomen, right below the ribs (flank pain). Pain usually spreads (radiates) to the groin. Needing to urinate often or urgently. Painful urination. Blood in the urine (hematuria). Nausea. Vomiting. Fever and chills. How is this diagnosed? This condition may be diagnosed based on: Your symptoms and medical history. A physical exam. Blood tests. Urine tests. These may be done before and after the stone passes out of your body through urination. Imaging tests, such as a CT scan, abdominal X-ray, or ultrasound. A procedure to examine the inside of the bladder (cystoscopy). How is this treated? Treatment for kidney stones depends on the size, location, and makeup of the stones. Kidney stones will often pass out of the body through urination. You may need to: Increase your fluid intake to help pass the stone. In some cases, you may be given fluids through an IV and may need to be monitored in the hospital. Take medicine for pain. Make changes in your diet to help prevent kidney stones from coming back. Sometimes, procedures are needed to remove a kidney stone. This may involve: A procedure to break up kidney stones using: A focused beam of light (laser therapy). Shock waves (extracorporeal shock wave lithotripsy). Surgery to remove kidney stones. This may be needed if you have severe pain or have stones that block your urinary tract. Follow these instructions at home: Medicines Take over-the-counter and prescription medicines only as told by your health care provider. Ask your health care provider if the medicine prescribed to you requires you to avoid driving or using heavy machinery. Eating and drinking Drink enough fluid to keep your urine pale yellow. You may be instructed to drink at least 8-10 glasses of water each day. This will help you pass the kidney stone. If directed, change  your diet. This may include: Limiting how much sodium you eat. Eating more fruits and vegetables. Limiting how much animal protein you eat. Animal proteins include red meat, poultry, fish, and eggs. Eating a normal amount of calcium (1,000-1,300 mg per day). Follow instructions from your health care provider about eating or drinking restrictions. General instructions Collect urine samples as told by your health care provider. You may need to collect a urine sample: 24 hours after you pass the stone. 8-12 weeks after you pass the kidney stone, and every 6-12 months after that. Strain your urine every time you urinate, for as long as directed. Use the strainer that your health care provider recommends. Do not throw out the kidney stone after passing it. Keep the stone so it can be tested by your health care provider. Testing the makeup of your kidney stone may help prevent you from getting kidney stones in the future. Keep all follow-up visits. You may need follow-up X-rays or ultrasounds to make sure that your stone has passed. How is this prevented? To prevent another kidney stone: Drink enough fluid to keep your urine pale yellow. This is the best way  to prevent kidney stones. Eat a healthy diet. Follow recommendations from your health care provider about foods to avoid. Recommendations vary depending on the type of kidney stone that you have. You may be instructed to eat a low-protein diet. Maintain a healthy weight. Where to find more information National Kidney Foundation (NKF): www.kidney.org [facility]): www.urologyhealth.org Contact a health care provider if: You have pain that gets worse or does not get better with medicine. Get help right away if: You have a fever or chills. You develop severe pain. You develop new abdominal pain. You faint. You are unable to urinate. Summary Kidney stones are solid, rock-like deposits that form inside of the kidneys. Kidney  stones can cause nausea, vomiting, blood in the urine, abdominal pain, and the urge to urinate often. Treatment for kidney stones depends on the size, location, and makeup of the stones. Kidney stones will often pass out of the body through urination. Kidney stones can be prevented by drinking enough fluids, eating a healthy diet, and maintaining a healthy weight. This information is not intended to replace advice given to you by your health care provider. Make sure you discuss any questions you have with your health care provider. Document Revised: 03/14/2022 Document Reviewed: 03/14/2022 Elsevier Patient Education  2024 Elsevier Inc.   If you have been instructed to have an in-person evaluation today at a local Urgent Care facility, please use the link below. It will take you to a list of all of our available [facility], including address, phone number and hours of operation. Please do not delay care.  [facility]  If you or a family member do not have a primary care provider, use the link below to schedule a visit and establish care. When you choose a [facility] primary care physician or advanced practice provider, you gain a long-term partner in health. Find a Primary Care Provider  Learn more about [facility] in-office and virtual care options: [facility] - Get Care Now

## 2024-02-21 NOTE — Progress Notes (Addendum)
 Virtual Visit Consent   Mark Juarez, you are scheduled for a virtual visit with a [facility] provider today. Just as with appointments in the office, your consent must be obtained to participate. Your consent will be active for this visit and any virtual visit you may have with one of our providers in the next 365 days. If you have a MyChart account, a copy of this consent can be sent to you electronically.  As this is a virtual visit, video technology does not allow for your provider to perform a traditional examination. This may limit your provider's ability to fully assess your condition. If your provider identifies any concerns that need to be evaluated in person or the need to arrange testing (such as labs, EKG, etc.), we will make arrangements to do so. Although advances in technology are sophisticated, we cannot ensure that it will always work on either your end or our end. If the connection with a video visit is poor, the visit may have to be switched to a telephone visit. With either a video or telephone visit, we are not always able to ensure that we have a secure connection.  By engaging in this virtual visit, you consent to the provision of healthcare and authorize for your insurance to be billed (if applicable) for the services provided during this visit. Depending on your insurance coverage, you may receive a charge related to this service.  I need to obtain your verbal consent now. Are you willing to proceed with your visit today? Mark Juarez has provided verbal consent on 02/21/2024 for a virtual visit (video or telephone). Margaretann Loveless, PA-C  Date: 02/21/2024 5:03 PM   Virtual Visit via Video Note   I, Margaretann Loveless, connected with  Mark Juarez  ([id number], 08/11/1996) on 02/21/24 at  4:45 PM EST by a video-enabled telemedicine application and verified that I am speaking with the correct person using two identifiers.  Location: Patient: Virtual Visit Location  Patient: Home Provider: Virtual Visit Location Provider: Home Office   I discussed the limitations of evaluation and management by telemedicine and the availability of in person appointments. The patient expressed understanding and agreed to proceed.    History of Present Illness: Mark Juarez is a 28 y.o. who identifies as a male who was assigned male at birth, and is being seen today for hematuria.  HPI: Hematuria This is a new problem. The current episode started yesterday. The problem has been resolved since onset. He describes the hematuria as gross hematuria. The hematuria occurs during the initial portion of his urinary stream. He reports clotting at the beginning of his urine stream. The pain is mild. He describes his urine color as clear. Irritative symptoms do not include frequency, nocturia or urgency. Obstructive symptoms include incomplete emptying (sometimes). Obstructive symptoms do not include dribbling, an intermittent stream, a slower stream, straining or a weak stream. Associated symptoms include bladder pain (had 2 days prior) and flank pain (left side for a few days). Pertinent negatives include no abdominal pain, chills, dysuria, fever, hesitancy, inability to urinate, nausea, urinary retention or vomiting. He is not sexually active. His past medical history is significant for recent infection (sinusitis currently, doxycycline). There is no history of BPH, GU trauma, kidney stones, prostatitis or STDs.     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   Mild intermittent  asthma without complication 08/05/2019   Seasonal and perennial allergic rhinoconjunctivitis 08/05/2019   Asthma 07/20/2019    Allergies:  Allergies  Allergen Reactions   Peanuts [Peanut Oil] Anaphylaxis   Penicillins Swelling   Medications:  Current Outpatient Medications:    albuterol (PROVENTIL)  (2.5 MG/3ML) 0.083% nebulizer solution, Take 3 mLs (2.5 mg total) by nebulization every 6 (six) hours as needed for wheezing or shortness of breath., Disp: 150 mL, Rfl: 3   albuterol (VENTOLIN HFA) 108 (90 Base) MCG/ACT inhaler, Inhale 1-2 puffs into the lungs every 6 (six) hours as needed for wheezing or shortness of breath., Disp: 8.5 g, Rfl: 5   cyclobenzaprine (FLEXERIL) 5 MG tablet, Take 1 tablet (5 mg total) by mouth at bedtime., Disp: 30 tablet, Rfl: 0   diclofenac (VOLTAREN) 75 MG EC tablet, Take 1 tablet (75 mg total) by mouth 2 (two) times daily., Disp: 30 tablet, Rfl: 0   doxycycline (VIBRA-TABS) 100 MG tablet, Take 1 tablet (100 mg total) by mouth 2 (two) times daily., Disp: 20 tablet, Rfl: 0   EPINEPHrine (EPIPEN 2-PAK) 0.3 mg/0.3 mL IJ SOAJ injection, Inject 0.3 mg into the muscle as needed for anaphylaxis., Disp: 1 each, Rfl: PRN   fluticasone (FLONASE) 50 MCG/ACT nasal spray, Place 2 sprays into both nostrils daily., Disp: 16 g, Rfl: 6   levalbuterol (XOPENEX HFA) 45 MCG/ACT inhaler, Inhale 1-2 puffs into the lungs every 6 (six) hours as needed for wheezing., Disp: 1 each, Rfl: 12   loratadine (CLARITIN) 10 MG tablet, Take 10 mg by mouth daily., Disp: , Rfl:   Observations/Objective: Patient is well-developed, well-nourished in no acute distress.  Resting comfortably at home.  Head is normocephalic, atraumatic.  No labored breathing.  Speech is clear and coherent with logical content.  Patient is alert and oriented at baseline.    Assessment and Plan: 1. Gross hematuria (Primary)  - One episode of gross hematuria without any history of injury to the penis that came following having a sharp pain with mild burning in the urethra, suspected to be a possible kidney stone that passed (positive family history, but no personal history) - No systemic symptoms to indicate infection - Symptoms improving with pushing fluids since having the episode of hematuria - Advised to continue to  push fluids and stay hydrated - Can use tylenol and/or Ibuprofen if needed for discomfort - Seek in person evaluation if symptoms recur  Follow Up Instructions: I discussed the assessment and treatment plan with the patient. The patient was provided an opportunity to ask questions and all were answered. The patient agreed with the plan and demonstrated an understanding of the instructions.  A copy of instructions were sent to the patient via MyChart unless otherwise noted below.    The patient was advised to call back or seek an in-person evaluation if the symptoms worsen or if the condition fails to improve as anticipated.    Margaretann Loveless, PA-C

## 2024-08-24 ENCOUNTER — Telehealth: Payer: Self-pay | Admitting: Physician Assistant

## 2024-08-24 DIAGNOSIS — R197 Diarrhea, unspecified: Secondary | ICD-10-CM

## 2024-08-24 MED ORDER — DICYCLOMINE HCL 10 MG PO CAPS: 10.0000 mg | ORAL_CAPSULE | Freq: Three times a day (TID) | ORAL | 0 refills | Status: AC

## 2024-08-24 NOTE — Progress Notes (Signed)
 Virtual Visit Consent   Mark Juarez, you are scheduled for a virtual visit with a [name] provider today. Just as with appointments in the office, your consent must be obtained to participate. Your consent will be active for this visit and any virtual visit you may have with one of our providers in the next 365 days. If you have a MyChart account, a copy of this consent can be sent to you electronically.  As this is a virtual visit, video technology does not allow for your provider to perform a traditional examination. This may limit your provider's ability to fully assess your condition. If your provider identifies any concerns that need to be evaluated in person or the need to arrange testing (such as labs, EKG, etc.), we will make arrangements to do so. Although advances in technology are sophisticated, we cannot ensure that it will always work on either your end or our end. If the connection with a video visit is poor, the visit may have to be switched to a telephone visit. With either a video or telephone visit, we are not always able to ensure that we have a secure connection.  By engaging in this virtual visit, you consent to the provision of healthcare and authorize for your insurance to be billed (if applicable) for the services provided during this visit. Depending on your insurance coverage, you may receive a charge related to this service.  I need to obtain your verbal consent now. Are you willing to proceed with your visit today? Plez Belton Placeres has provided verbal consent on 08/24/2024 for a virtual visit (video or telephone). Delon CHRISTELLA Dickinson, PA-C  Date: 08/24/2024 9:11 AM   Virtual Visit via Video Note   I, Delon CHRISTELLA Dickinson, connected with  Mark Juarez  ([id number], 1996/08/09) on 08/24/24 at  9:00 AM EDT by a video-enabled telemedicine application and verified that I am speaking with the correct person using two identifiers.  Location: Patient: Virtual Visit Location  Patient: Home Provider: Virtual Visit Location Provider: Home Office   I discussed the limitations of evaluation and management by telemedicine and the availability of in person appointments. The patient expressed understanding and agreed to proceed.    History of Present Illness: Mark Juarez is a 28 y.o. who identifies as a male who was assigned male at birth, and is being seen today for diarrhea.  HPI: Diarrhea  This is a new problem. The current episode started in the past 7 days (Saturday, 08/22/24). The problem occurs more than 10 times per day. The problem has been unchanged. The stool consistency is described as Watery. The patient states that diarrhea awakens him from sleep. Associated symptoms include abdominal pain, bloating (now improved), increased flatus and myalgias (now improved). Pertinent negatives include no chills, fever, headaches, sweats or vomiting. Nothing aggravates the symptoms. Risk factors include ill contacts (did have a med rare steak on friday). He has tried anti-motility drug (Imodium, did help) for the symptoms. The treatment provided moderate relief.     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   Mild intermittent asthma without complication 08/05/2019   Seasonal and perennial allergic rhinoconjunctivitis 08/05/2019   Asthma 07/20/2019    Allergies:  Allergies  Allergen Reactions   Peanuts [Peanut  Oil] Anaphylaxis   Penicillins Swelling   Medications:  Current Outpatient Medications:    dicyclomine  (BENTYL ) 10 MG capsule,  Take 1 capsule (10 mg total) by mouth 4 (four) times daily -  before meals and at bedtime., Disp: 28 capsule, Rfl: 0   albuterol  (PROVENTIL ) (2.5 MG/3ML) 0.083% nebulizer solution, Take 3 mLs (2.5 mg total) by nebulization every 6 (six) hours as needed for wheezing or shortness of breath., Disp: 150 mL, Rfl: 3   albuterol   (VENTOLIN  HFA) 108 (90 Base) MCG/ACT inhaler, Inhale 1-2 puffs into the lungs every 6 (six) hours as needed for wheezing or shortness of breath., Disp: 8.5 g, Rfl: 5   cyclobenzaprine  (FLEXERIL ) 5 MG tablet, Take 1 tablet (5 mg total) by mouth at bedtime., Disp: 30 tablet, Rfl: 0   diclofenac  (VOLTAREN ) 75 MG EC tablet, Take 1 tablet (75 mg total) by mouth 2 (two) times daily., Disp: 30 tablet, Rfl: 0   doxycycline  (VIBRA -TABS) 100 MG tablet, Take 1 tablet (100 mg total) by mouth 2 (two) times daily., Disp: 20 tablet, Rfl: 0   EPINEPHrine  (EPIPEN  2-PAK) 0.3 mg/0.3 mL IJ SOAJ injection, Inject 0.3 mg into the muscle as needed for anaphylaxis., Disp: 1 each, Rfl: PRN   fluticasone  (FLONASE ) 50 MCG/ACT nasal spray, Place 2 sprays into both nostrils daily., Disp: 16 g, Rfl: 6   levalbuterol  (XOPENEX  HFA) 45 MCG/ACT inhaler, Inhale 1-2 puffs into the lungs every 6 (six) hours as needed for wheezing., Disp: 1 each, Rfl: 12   loratadine (CLARITIN) 10 MG tablet, Take 10 mg by mouth daily., Disp: , Rfl:   Observations/Objective: Patient is well-developed, well-nourished in no acute distress.  Resting comfortably at home.  Head is normocephalic, atraumatic.  No labored breathing.  Speech is clear and coherent with logical content.  Patient is alert and oriented at baseline.    Assessment and Plan: 1. Diarrhea, unspecified type (Primary) - dicyclomine  (BENTYL ) 10 MG capsule; Take 1 capsule (10 mg total) by mouth 4 (four) times daily -  before meals and at bedtime.  Dispense: 28 capsule; Refill: 0  - Suspect viral gastroenteritis - Bentyl  for diarrhea and cramping - Imodium for diarrhea - Push fluids, electrolyte beverages - Liquid diet, then increase to soft/bland (BRAT) diet over next day, then increase diet as tolerated - Work note Provided - Seek in person evaluation if not improving or symptoms worsen   Follow Up Instructions: I discussed the assessment and treatment plan with the patient.  The patient was provided an opportunity to ask questions and all were answered. The patient agreed with the plan and demonstrated an understanding of the instructions.  A copy of instructions were sent to the patient via MyChart unless otherwise noted below.     The patient was advised to call back or seek an in-person evaluation if the symptoms worsen or if the condition fails to improve as anticipated.    Delon CHRISTELLA Dickinson, PA-C

## 2024-08-24 NOTE — Patient Instructions (Signed)
 Sharran CHRISTELLA Schmitz, thank you for joining Delon CHRISTELLA Dickinson, PA-C for today's virtual visit.  While this provider is not your primary care provider (PCP), if your PCP is located in our provider database this encounter information will be shared with them immediately following your visit.   A [name] MyChart account gives you access to today's visit and all your visits, tests, and labs performed at [facility]  click here if you don't have a [facility] MyChart account or go to [email or web address]  Consent: (Patient) Mark Juarez provided verbal consent for this virtual visit at the beginning of the encounter.  Current Medications:  Current Outpatient Medications:    dicyclomine  (BENTYL ) 10 MG capsule, Take 1 capsule (10 mg total) by mouth 4 (four) times daily -  before meals and at bedtime., Disp: 28 capsule, Rfl: 0   albuterol  (PROVENTIL ) (2.5 MG/3ML) 0.083% nebulizer solution, Take 3 mLs (2.5 mg total) by nebulization every 6 (six) hours as needed for wheezing or shortness of breath., Disp: 150 mL, Rfl: 3   albuterol  (VENTOLIN  HFA) 108 (90 Base) MCG/ACT inhaler, Inhale 1-2 puffs into the lungs every 6 (six) hours as needed for wheezing or shortness of breath., Disp: 8.5 g, Rfl: 5   cyclobenzaprine  (FLEXERIL ) 5 MG tablet, Take 1 tablet (5 mg total) by mouth at bedtime., Disp: 30 tablet, Rfl: 0   diclofenac  (VOLTAREN ) 75 MG EC tablet, Take 1 tablet (75 mg total) by mouth 2 (two) times daily., Disp: 30 tablet, Rfl: 0   doxycycline  (VIBRA -TABS) 100 MG tablet, Take 1 tablet (100 mg total) by mouth 2 (two) times daily., Disp: 20 tablet, Rfl: 0   EPINEPHrine  (EPIPEN  2-PAK) 0.3 mg/0.3 mL IJ SOAJ injection, Inject 0.3 mg into the muscle as needed for anaphylaxis., Disp: 1 each, Rfl: PRN   fluticasone  (FLONASE ) 50 MCG/ACT nasal spray, Place 2 sprays into both nostrils daily., Disp: 16 g, Rfl: 6   levalbuterol  (XOPENEX  HFA) 45 MCG/ACT inhaler, Inhale 1-2 puffs into the lungs every 6  (six) hours as needed for wheezing., Disp: 1 each, Rfl: 12   loratadine (CLARITIN) 10 MG tablet, Take 10 mg by mouth daily., Disp: , Rfl:    Medications ordered in this encounter:  Meds ordered this encounter  Medications   dicyclomine  (BENTYL ) 10 MG capsule    Sig: Take 1 capsule (10 mg total) by mouth 4 (four) times daily -  before meals and at bedtime.    Dispense:  28 capsule    Refill:  0    Supervising Provider:   BLAISE ALEENE KIDD [[id number]]     *If you need refills on other medications prior to your next appointment, please contact your pharmacy*  Follow-Up: Call back or seek an in-person evaluation if the symptoms worsen or if the condition fails to improve as anticipated.  [facility] [phone]  Other Instructions Food Choices to Help Relieve Diarrhea, Adult Diarrhea can make you feel weak and cause you to become dehydrated. Dehydration is a condition in which there is not enough water or other fluids in the body. It is important to choose the right foods and drinks to: Relieve diarrhea. Replace lost fluids and nutrients. Prevent dehydration. What are tips for following this plan? Relieving diarrhea Avoid foods that make your diarrhea worse. These may include: Foods and drinks that are sweetened with high-fructose corn syrup, honey, or sweeteners such as xylitol, sorbitol, and mannitol. Check food labels for these ingredients. Fried, greasy, or spicy foods. Raw  fruits and vegetables. Eat foods that are rich in probiotics. These include foods such as yogurt and fermented milk products. Probiotics can help increase healthy bacteria in your stomach and intestines (gastrointestinal or GI tract). This may help digestion and stop diarrhea. If you have lactose intolerance, avoid dairy products. These may make your diarrhea worse. Take medicine to help stop diarrhea only as told by your health care provider. Replacing nutrients  Eat bland, easy-to-digest foods  in small amounts as you are able, until your diarrhea starts to get better. These foods include bananas, applesauce, rice, toast, and crackers. Over time, add nutrient-rich foods as your body tolerates them or as told by your health care provider. These include: Well-cooked protein foods, such as eggs, lean meats like fish or chicken without skin, and tofu. Peeled, seeded, and soft-cooked fruits and vegetables. Low-fat dairy products. Whole grains. Take vitamin and mineral supplements as told by your health care provider. Preventing dehydration  Start by sipping water or a solution to prevent dehydration (oral rehydration solution, or ORS). This is a drink that helps replace fluids and minerals your body has lost. You can buy an ORS at pharmacies and retail stores. Try to drink at least 8-10 cups (2,000-2,500 mL) of fluid each day to help replace lost fluids. If your urine is pale yellow, you are getting enough fluids. You may drink other liquids in addition to water, such as fruit juice that you have added water to (diluted fruit juice) or low-calorie sports drinks, as tolerated or as told by your health care provider. Avoid drinks with caffeine, such as coffee, tea, or soft drinks. Avoid alcohol. This information is not intended to replace advice given to you by your health care provider. Make sure you discuss any questions you have with your health care provider. Document Revised: 05/22/2022 Document Reviewed: 05/22/2022 Elsevier Patient Education  2024 Elsevier Inc.   If you have been instructed to have an in-person evaluation today at a local Urgent Care facility, please use the link below. It will take you to a list of all of our available [facility], including address, phone number and hours of operation. Please do not delay care.  [facility]  If you or a family member do not have a primary care provider, use the link below to schedule a visit and establish  care. When you choose a [facility] primary care physician or advanced practice provider, you gain a long-term partner in health. Find a Primary Care Provider  Learn more about [facility]'s in-office and virtual care options: [facility] - Get Care Now

## 2024-11-17 ENCOUNTER — Telehealth: Payer: Self-pay | Admitting: Physician Assistant

## 2024-11-17 DIAGNOSIS — A084 Viral intestinal infection, unspecified: Secondary | ICD-10-CM

## 2024-11-17 MED ORDER — ONDANSETRON 4 MG PO TBDP: 4.0000 mg | ORAL_TABLET | Freq: Three times a day (TID) | ORAL | 0 refills | Status: AC | PRN

## 2024-11-17 NOTE — Patient Instructions (Signed)
 Sharran HERO Bretado, thank you for joining [name], PA-C for today's virtual visit.  While this provider is not your primary care provider (PCP), if your PCP is located in our provider database this encounter information will be shared with them immediately following your visit.   A [facility] MyChart account gives you access to today's visit and all your visits, tests, and labs performed at [facility]  click here if you don't have a [facility] MyChart account or go to [email or web address]  Consent: (Patient) Mark Juarez provided verbal consent for this virtual visit at the beginning of the encounter.  Current Medications:  Current Outpatient Medications:    albuterol  (PROVENTIL ) (2.5 MG/3ML) 0.083% nebulizer solution, Take 3 mLs (2.5 mg total) by nebulization every 6 (six) hours as needed for wheezing or shortness of breath., Disp: 150 mL, Rfl: 3   albuterol  (VENTOLIN  HFA) 108 (90 Base) MCG/ACT inhaler, Inhale 1-2 puffs into the lungs every 6 (six) hours as needed for wheezing or shortness of breath., Disp: 8.5 g, Rfl: 5   cyclobenzaprine  (FLEXERIL ) 5 MG tablet, Take 1 tablet (5 mg total) by mouth at bedtime., Disp: 30 tablet, Rfl: 0   diclofenac  (VOLTAREN ) 75 MG EC tablet, Take 1 tablet (75 mg total) by mouth 2 (two) times daily., Disp: 30 tablet, Rfl: 0   dicyclomine  (BENTYL ) 10 MG capsule, Take 1 capsule (10 mg total) by mouth 4 (four) times daily -  before meals and at bedtime., Disp: 28 capsule, Rfl: 0   doxycycline  (VIBRA -TABS) 100 MG tablet, Take 1 tablet (100 mg total) by mouth 2 (two) times daily., Disp: 20 tablet, Rfl: 0   EPINEPHrine  (EPIPEN  2-PAK) 0.3 mg/0.3 mL IJ SOAJ injection, Inject 0.3 mg into the muscle as needed for anaphylaxis., Disp: 1 each, Rfl: PRN   fluticasone  (FLONASE ) 50 MCG/ACT nasal spray, Place 2 sprays into both nostrils daily., Disp: 16 g, Rfl: 6   levalbuterol  (XOPENEX  HFA) 45 MCG/ACT inhaler, Inhale 1-2 puffs into the lungs every 6  (six) hours as needed for wheezing., Disp: 1 each, Rfl: 12   loratadine (CLARITIN) 10 MG tablet, Take 10 mg by mouth daily., Disp: , Rfl:    Medications ordered in this encounter:  No orders of the defined types were placed in this encounter.    *If you need refills on other medications prior to your next appointment, please contact your pharmacy*  Follow-Up: Call back or seek an in-person evaluation if the symptoms worsen or if the condition fails to improve as anticipated.  [facility] [phone]  Other Instructions Viral Gastroenteritis, Adult  Viral gastroenteritis is also known as the stomach flu. This condition may affect your stomach, your small intestine, and your large intestine. It can cause sudden watery poop (diarrhea), fever, and vomiting. This condition is caused by certain germs (viruses). These germs can be passed from person to person very easily (are contagious). Having watery poop and vomiting can make you feel weak and cause you to not have enough water in your body (get dehydrated). This can make you tired and thirsty, make you have a dry mouth, and make it so you pee (urinate) less often. It is important to replace the fluids that you lose from having watery poop and vomiting. What are the causes? You can get sick by catching germs from other people. You can also get sick by: Eating food, drinking water, or touching a surface that has the germs on it (is contaminated). Sharing utensils  or other personal items with a person who is sick. What increases the risk? Having a weak body defense system (immune system). Living with one or more children who are younger than 2 years. Living in a nursing home. Going on cruise ships. What are the signs or symptoms? Symptoms of this condition start suddenly. Symptoms may last for a few days or for as long as a week. Common symptoms include: Watery poop. Vomiting. Other symptoms  include: Fever. Headache. Feeling tired (fatigue). Pain in the belly (abdomen). Chills. Feeling weak. Feeling like you may vomit (nauseous). Muscle aches. Not feeling hungry. How is this treated? This condition typically goes away on its own. The focus of treatment is to replace the fluids that you lose. This condition may be treated with: An ORS (oral rehydration solution). This is a drink that helps you replace fluids and minerals your body lost. It is sold at pharmacies and stores. Medicines to help with your symptoms. Probiotic supplements to reduce symptoms of watery poop. Fluids given through an IV tube, if needed. Older adults and people with other diseases or a weak body defense system are at higher risk for not having enough water in the body. Follow these instructions at home: Eating and drinking  Take an ORS as told by your doctor. Drink clear fluids in small amounts as you are able. Clear fluids include: Water. Ice chips. Fruit juice that has water added to it (is diluted). Low-calorie sports drinks. Drink enough fluid to keep your pee (urine) pale yellow. Eat small amounts of healthy foods every 3-4 hours as you are able. This may include whole grains, fruits, vegetables, lean meats, and yogurt. Avoid fluids that have a lot of sugar or caffeine in them. This includes energy drinks, sports drinks, and soda. Avoid spicy or fatty foods. Avoid alcohol. General instructions  Wash your hands often. This is very important after you have watery poop or you vomit. If you cannot use soap and water, use hand sanitizer. Make sure that all people in your home wash their hands well and often. Take over-the-counter and prescription medicines only as told by your doctor. Rest at home while you get better. Watch your condition for any changes. Take a warm bath to help with any burning or pain from having watery poop. Keep all follow-up visits. Contact a doctor if: You cannot keep  fluids down. Your symptoms get worse. You have new symptoms. You feel light-headed or dizzy. You have muscle cramps. Get help right away if: You have chest pain. You have trouble breathing, or you are breathing very fast. You have a fast heartbeat. You feel very weak or you faint. You have a very bad headache, a stiff neck, or both. You have a rash. You have very bad pain, cramping, or bloating in your belly. Your skin feels cold and clammy. You feel mixed up (confused). You have pain when you pee. You have signs of not having enough water in the body, such as: Dark pee, hardly any pee, or no pee. Cracked lips. Dry mouth. Sunken eyes. Feeling very sleepy. Feeling weak. You have signs of bleeding, such as: You see blood in your vomit. Your vomit looks like coffee grounds. You have bloody or black poop or poop that looks like tar. These symptoms may be an emergency. Get help right away. Call 911. Do not wait to see if the symptoms will go away. Do not drive yourself to the hospital. Summary Viral gastroenteritis is also known as  the stomach flu. This condition can cause sudden watery poop (diarrhea), fever, and vomiting. These germs can be passed from person to person very easily. Take an ORS (oral rehydration solution) as told by your doctor. This is a drink that is sold at pharmacies and stores. Wash your hands often, especially after having watery poop or vomiting. If you cannot use soap and water, use hand sanitizer. This information is not intended to replace advice given to you by your health care provider. Make sure you discuss any questions you have with your health care provider. Document Revised: 10/02/2021 Document Reviewed: 10/02/2021 Elsevier Patient Education  2024 Elsevier Inc.  Food Choices to Help Relieve Diarrhea, Adult Diarrhea can make you feel weak and cause you to become dehydrated. Dehydration is a condition in which there is not enough water or other  fluids in the body. It is important to choose the right foods and drinks to: Relieve diarrhea. Replace lost fluids and nutrients. Prevent dehydration. What are tips for following this plan? Relieving diarrhea Avoid foods that make your diarrhea worse. These may include: Foods and drinks that are sweetened with high-fructose corn syrup, honey, or sweeteners such as xylitol, sorbitol, and mannitol. Check food labels for these ingredients. Fried, greasy, or spicy foods. Raw fruits and vegetables. Eat foods that are rich in probiotics. These include foods such as yogurt and fermented milk products. Probiotics can help increase healthy bacteria in your stomach and intestines (gastrointestinal or GI tract). This may help digestion and stop diarrhea. If you have lactose intolerance, avoid dairy products. These may make your diarrhea worse. Take medicine to help stop diarrhea only as told by your health care provider. Replacing nutrients  Eat bland, easy-to-digest foods in small amounts as you are able, until your diarrhea starts to get better. These foods include bananas, applesauce, rice, toast, and crackers. Over time, add nutrient-rich foods as your body tolerates them or as told by your health care provider. These include: Well-cooked protein foods, such as eggs, lean meats like fish or chicken without skin, and tofu. Peeled, seeded, and soft-cooked fruits and vegetables. Low-fat dairy products. Whole grains. Take vitamin and mineral supplements as told by your health care provider. Preventing dehydration  Start by sipping water or a solution to prevent dehydration (oral rehydration solution, or ORS). This is a drink that helps replace fluids and minerals your body has lost. You can buy an ORS at pharmacies and retail stores. Try to drink at least 8-10 cups (2,000-2,500 mL) of fluid each day to help replace lost fluids. If your urine is pale yellow, you are getting enough fluids. You may drink  other liquids in addition to water, such as fruit juice that you have added water to (diluted fruit juice) or low-calorie sports drinks, as tolerated or as told by your health care provider. Avoid drinks with caffeine, such as coffee, tea, or soft drinks. Avoid alcohol. This information is not intended to replace advice given to you by your health care provider. Make sure you discuss any questions you have with your health care provider. Document Revised: 05/22/2022 Document Reviewed: 05/22/2022 Elsevier Patient Education  2024 Elsevier Inc.   If you have been instructed to have an in-person evaluation today at a local Urgent Care facility, please use the link below. It will take you to a list of all of our available [facility], including address, phone number and hours of operation. Please do not delay care.  [facility]  If you or a family member do not have a primary care provider, use the link below to schedule a visit and establish care. When you choose a [facility] primary care physician or advanced practice provider, you gain a long-term partner in health. Find a Primary Care Provider  Learn more about [facility]'s in-office and virtual care options: [facility] - Get Care Now

## 2024-11-17 NOTE — Progress Notes (Signed)
 Virtual Visit Consent   Mark Juarez, you are scheduled for a virtual visit with a [facility] provider today. Just as with appointments in the office, your consent must be obtained to participate. Your consent will be active for this visit and any virtual visit you may have with one of our providers in the next 365 days. If you have a MyChart account, a copy of this consent can be sent to you electronically.  As this is a virtual visit, video technology does not allow for your provider to perform a traditional examination. This may limit your provider's ability to fully assess your condition. If your provider identifies any concerns that need to be evaluated in person or the need to arrange testing (such as labs, EKG, etc.), we will make arrangements to do so. Although advances in technology are sophisticated, we cannot ensure that it will always work on either your end or our end. If the connection with a video visit is poor, the visit may have to be switched to a telephone visit. With either a video or telephone visit, we are not always able to ensure that we have a secure connection.  By engaging in this virtual visit, you consent to the provision of healthcare and authorize for your insurance to be billed (if applicable) for the services provided during this visit. Depending on your insurance coverage, you may receive a charge related to this service.  I need to obtain your verbal consent now. Are you willing to proceed with your visit today? Mark Juarez has provided verbal consent on 11/17/2024 for a virtual visit (video or telephone). [name], [place]  Date: 11/17/2024 10:12 AM   Virtual Visit via Video Note   I, [name], connected with  Mark Juarez  ([id number], 09/15/1996) on 11/17/24 at 10:00 AM EST by a video-enabled telemedicine application and verified that I am speaking with the correct person using two identifiers.  Location: Patient: Virtual Visit Location  Patient: Home Provider: Virtual Visit Location Provider: Home Office   I discussed the limitations of evaluation and management by telemedicine and the availability of in person appointments. The patient expressed understanding and agreed to proceed.    History of Present Illness: Mark Juarez is a 28 y.o. who identifies as a male who was assigned male at birth, and is being seen today for diarrhea and upset stomach starting last night between 11PM-12AM. Notes abdominal cramping, nausea without emesis, and several bouts of non-bloody diarrhea. Thinks maybe a low-grade fever. Denies any rectal pain. Denies recent travel or known sick contact.   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   Mild intermittent asthma without complication 08/05/2019   Seasonal and perennial allergic rhinoconjunctivitis 08/05/2019   Asthma 07/20/2019    Allergies:  Allergies  Allergen Reactions   Peanuts [Peanut  Oil] Anaphylaxis   Penicillins Swelling   Medications:  Current Outpatient Medications:    ondansetron (ZOFRAN-ODT) 4 MG disintegrating tablet, Take 1 tablet (4 mg total) by mouth every 8 (eight) hours as needed for nausea or vomiting., Disp: 20 tablet, Rfl: 0   albuterol  (PROVENTIL ) (2.5 MG/3ML) 0.083% nebulizer solution, Take 3 mLs (2.5 mg total) by nebulization every 6 (six) hours as needed for wheezing or shortness of breath., Disp: 150 mL, Rfl: 3   albuterol  (VENTOLIN  HFA) 108 (90 Base) MCG/ACT  inhaler, Inhale 1-2 puffs into the lungs every 6 (six) hours as needed for wheezing or shortness of breath., Disp: 8.5 g, Rfl: 5   cyclobenzaprine  (FLEXERIL ) 5 MG tablet, Take 1 tablet (5 mg total) by mouth at bedtime., Disp: 30 tablet, Rfl: 0   diclofenac  (VOLTAREN ) 75 MG EC tablet, Take 1 tablet (75 mg total) by mouth 2 (two) times daily., Disp: 30 tablet, Rfl: 0    dicyclomine  (BENTYL ) 10 MG capsule, Take 1 capsule (10 mg total) by mouth 4 (four) times daily -  before meals and at bedtime., Disp: 28 capsule, Rfl: 0   doxycycline  (VIBRA -TABS) 100 MG tablet, Take 1 tablet (100 mg total) by mouth 2 (two) times daily., Disp: 20 tablet, Rfl: 0   EPINEPHrine  (EPIPEN  2-PAK) 0.3 mg/0.3 mL IJ SOAJ injection, Inject 0.3 mg into the muscle as needed for anaphylaxis., Disp: 1 each, Rfl: PRN   fluticasone  (FLONASE ) 50 MCG/ACT nasal spray, Place 2 sprays into both nostrils daily., Disp: 16 g, Rfl: 6   levalbuterol  (XOPENEX  HFA) 45 MCG/ACT inhaler, Inhale 1-2 puffs into the lungs every 6 (six) hours as needed for wheezing., Disp: 1 each, Rfl: 12   loratadine (CLARITIN) 10 MG tablet, Take 10 mg by mouth daily., Disp: , Rfl:   Observations/Objective: Patient is well-developed, well-nourished in no acute distress.  Resting comfortably  at home.  Head is normocephalic, atraumatic.  No labored breathing.  Speech is clear and coherent with logical content.  Patient is alert and oriented at baseline.   Assessment and Plan: 1. Viral gastroenteritis (Primary) - ondansetron (ZOFRAN-ODT) 4 MG disintegrating tablet; Take 1 tablet (4 mg total) by mouth every 8 (eight) hours as needed for nausea or vomiting.  Dispense: 20 tablet; Refill: 0  No alarm signs or symptoms present. Supportive measures and OTC medications reviewed. Start [employer] Zofran per orders. Ok to continue Imodium. Strict in-person evaluation precautions reviewed with patient. Work note provided.  Follow Up Instructions: I discussed the assessment and treatment plan with the patient. The patient was provided an opportunity to ask questions and all were answered. The patient agreed with the plan and demonstrated an understanding of the instructions.  A copy of instructions were sent to the patient via MyChart unless otherwise noted below.   The patient was advised to call back or seek an in-person evaluation  if the symptoms worsen or if the condition fails to improve as anticipated.    [name], PA-C
